# Patient Record
Sex: Male | Born: 1965 | Race: White | Hispanic: No | Marital: Single
Health system: Southern US, Community
[De-identification: ages and names within clinical notes are randomized; demographics above are authoritative.]

## PROBLEM LIST (undated history)

## (undated) DIAGNOSIS — E119 Type 2 diabetes mellitus without complications: Secondary | ICD-10-CM

---

## 2013-11-22 ENCOUNTER — Ambulatory Visit: Payer: Self-pay | Admitting: Anesthesiology

## 2013-11-22 LAB — BASIC METABOLIC PANEL
ANION GAP: 2 — AB (ref 7–16)
BUN: 10 mg/dL (ref 7–18)
CO2: 35 mmol/L — AB (ref 21–32)
Calcium, Total: 9.4 mg/dL (ref 8.5–10.1)
Chloride: 100 mmol/L (ref 98–107)
Creatinine: 0.65 mg/dL (ref 0.60–1.30)
EGFR (African American): >60
EGFR (Non-African Amer.): >60
GLUCOSE: 350 mg/dL — AB (ref 65–99)
Osmolality: 287 (ref 275–301)
Potassium: 5.2 mmol/L — ABNORMAL HIGH (ref 3.5–5.1)
Sodium: 137 mmol/L (ref 136–145)

## 2013-11-27 ENCOUNTER — Ambulatory Visit: Payer: Self-pay | Admitting: Urology

## 2014-09-09 ENCOUNTER — Observation Stay: Payer: Self-pay | Admitting: Internal Medicine

## 2014-09-09 LAB — COMPREHENSIVE METABOLIC PANEL
ALT: 46 U/L (ref 14–63)
AST: 37 U/L (ref 15–37)
Albumin: 3.3 g/dL — ABNORMAL LOW (ref 3.4–5.0)
Alkaline Phosphatase: 115 U/L (ref 46–116)
Anion Gap: 10 (ref 7–16)
BUN: 11 mg/dL (ref 7–18)
Bilirubin,Total: 0.3 mg/dL (ref 0.2–1.0)
CHLORIDE: 100 mmol/L (ref 98–107)
Calcium, Total: 9.3 mg/dL (ref 8.5–10.1)
Co2: 27 mmol/L (ref 21–32)
Creatinine: 0.69 mg/dL (ref 0.60–1.30)
EGFR (Non-African Amer.): >60
GLUCOSE: 126 mg/dL — AB (ref 65–99)
Osmolality: 275 (ref 275–301)
Potassium: 4.6 mmol/L (ref 3.5–5.1)
Sodium: 137 mmol/L (ref 136–145)
Total Protein: 7.1 g/dL (ref 6.4–8.2)

## 2014-09-09 LAB — CBC WITH DIFFERENTIAL/PLATELET
Bands: 7 %
Comment - H1-Com3: NORMAL
HCT: 35.8 % — ABNORMAL LOW (ref 40.0–52.0)
HGB: 12 g/dL — ABNORMAL LOW (ref 13.0–18.0)
LYMPHS PCT: 10 %
MCH: 30.2 pg (ref 26.0–34.0)
MCHC: 33.5 g/dL (ref 32.0–36.0)
MCV: 90 fL (ref 80–100)
MONOS PCT: 9 %
Myelocyte: 1 %
PLATELETS: 206 10*3/uL (ref 150–440)
RBC: 3.97 10*6/uL — AB (ref 4.40–5.90)
RDW: 13.3 % (ref 11.5–14.5)
Segmented Neutrophils: 73 %
WBC: 10.8 10*3/uL — AB (ref 3.8–10.6)

## 2014-09-09 LAB — URINALYSIS, COMPLETE
BLOOD: NEGATIVE
Bacteria: NONE SEEN
Bilirubin,UR: NEGATIVE
Glucose,UR: NEGATIVE mg/dL (ref 0–75)
KETONE: NEGATIVE
Leukocyte Esterase: NEGATIVE
Nitrite: NEGATIVE
PH: 6 (ref 4.5–8.0)
RBC,UR: NONE SEEN /HPF (ref 0–5)
Specific Gravity: 1.006 (ref 1.003–1.030)
Squamous Epithelial: NONE SEEN
WBC UR: NONE SEEN /HPF (ref 0–5)

## 2014-09-09 LAB — TROPONIN I: Troponin-I: <0.02 ng/mL

## 2014-09-09 LAB — PHOSPHORUS: PHOSPHORUS: 4.1 mg/dL (ref 2.5–4.9)

## 2014-09-09 LAB — PROTIME-INR
INR: 1
Prothrombin Time: 13.5 secs

## 2014-09-09 LAB — MAGNESIUM: MAGNESIUM: 1.5 mg/dL — AB

## 2014-09-10 LAB — CBC WITH DIFFERENTIAL/PLATELET
BASOS ABS: 0 10*3/uL (ref 0.0–0.1)
BASOS PCT: 0.4 %
Eosinophil #: 0.2 10*3/uL (ref 0.0–0.7)
Eosinophil %: 1.7 %
HCT: 30.7 % — AB (ref 40.0–52.0)
HGB: 10.2 g/dL — AB (ref 13.0–18.0)
LYMPHS PCT: 16 %
Lymphocyte #: 1.4 10*3/uL (ref 1.0–3.6)
MCH: 29.8 pg (ref 26.0–34.0)
MCHC: 33.4 g/dL (ref 32.0–36.0)
MCV: 89 fL (ref 80–100)
Monocyte #: 0.9 x10 3/mm (ref 0.2–1.0)
Monocyte %: 9.9 %
Neutrophil #: 6.5 10*3/uL (ref 1.4–6.5)
Neutrophil %: 72 %
Platelet: 207 10*3/uL (ref 150–440)
RBC: 3.43 10*6/uL — ABNORMAL LOW (ref 4.40–5.90)
RDW: 13.4 % (ref 11.5–14.5)
WBC: 9 10*3/uL (ref 3.8–10.6)

## 2014-09-10 LAB — BASIC METABOLIC PANEL
Anion Gap: 8 (ref 7–16)
BUN: 8 mg/dL (ref 7–18)
CALCIUM: 8.8 mg/dL (ref 8.5–10.1)
CREATININE: 0.8 mg/dL (ref 0.60–1.30)
Chloride: 103 mmol/L (ref 98–107)
Co2: 26 mmol/L (ref 21–32)
GLUCOSE: 133 mg/dL — AB (ref 65–99)
Osmolality: 274 (ref 275–301)
Potassium: 3.8 mmol/L (ref 3.5–5.1)
SODIUM: 137 mmol/L (ref 136–145)

## 2014-09-10 LAB — CULTURE, BLOOD (SINGLE)

## 2014-09-19 ENCOUNTER — Emergency Department: Payer: Self-pay | Admitting: Emergency Medicine

## 2014-11-06 ENCOUNTER — Emergency Department
Admit: 2014-11-06 | Disposition: A | Discharge status: Home / Self Care | Payer: Self-pay | Admitting: Emergency Medicine

## 2014-11-24 NOTE — Op Note (Signed)
PATIENT NAMPrince Haney:  [name], [name] MR#:  [id number] DATE OF BIRTH:  September 21, 1965  DATE OF PROCEDURE:  11/27/2013  PREOPERATIVE DIAGNOSES: 1.  Bilateral ureterolithiasis.  2.  Flank pain.   POSTOPERATIVE DIAGNOSIS:  Flank pain.   PROCEDURES: 1.  Bilateral ureteroscopy.  2.  Fluoroscopy.   SURGEON: Anola GurneyMichael [name], MD.  ANESTHESIOLOGISMaisie Haney:  [name].  ANESTHETIC METHOD: General per Brian Fushomas, local per [name].   INDICATIONS: See the dictated history and physical. After informed consent, the patient requests the above procedure.   OPERATIVE SUMMARY: After adequate general anesthesia had been obtained, the patient was placed into dorsal lithotomy position and the perineum was prepped and draped in the usual fashion. Fluoroscopy was performed. No obvious stones were identified. At this point, 21-French cystoscope was coupled with the camera and then visually advanced into the bladder. The bladder was thoroughly inspected. No bladder mucosal lesions were identified. Both ureteral orifices were identified and had clear efflux. A 0.035 Glidewire was advanced up the right ureter under fluoroscopic guidance. No resistance was encountered. The intramural ureter was dilated to 5 mm with a balloon dilating catheter. The balloon was then deflated and dilating catheter removed, taking care to leave the guidewire in position. The ureteral access sheath was then placed. The flexible ureteroscope was then visually advanced up the ureter. No stones were identified along the course of the ureter. The entire intrarenal collecting system was inspected and again no stones were identified. No other abnormalities were identified. At this point, the ureteroscope and access sheath were removed. The procedure was repeated on the left side in an identical fashion. Again, no stones, filling defects, tumors or lesions were identified within the entire collecting system. At this point, the access sheath and guidewire were removed on the left  side. Viscous Xylocaine 10 mL was instilled within the urethra and the bladder. The procedure was then terminated and the patient was transferred to the recovery room in stable condition.   ____________________________ Brian ConnersMichael R. [name], MD mrw:ce D: 11/27/2013 14:26:59 ET T: 11/27/2013 19:49:53 ET JOB#: [id number]  cc: Brian ConnersMichael R. [name], MD, <Dictator> Brian ApeMICHAEL R [name] MD ELECTRONICALLY SIGNED 11/28/2013 8:24

## 2014-12-02 NOTE — H&P (Signed)
PATIENT NAMPrince Haney:  [name], [name] MR#:  [id number] DATE OF BIRTH:  April 24, 1966  DATE OF ADMISSION:  09/09/2014  PRIMARY CARE PHYSICIAN: [facility]Leonard F. [facility]  REFERRING EMERGENCY ROOM PHYSICIAN: Brian Inchory R. York CeriseForbach, MD    CHIEF COMPLAINT: Leg swelling and redness.   HISTORY OF PRESENTING ILLNESS: This is a 49 year old male with a history of hypertension, diabetes, and hyperlipidemia who takes his medications regularly, who said that 4 days ago, he started feeling generalized aches and pains and noticed there was some redness on his left side of the leg. He took some more Motrin or Tylenol and went to work, but by evening started also feeling some fever. So the next day, he went to his primary care doctor's office and they gave him Bactrim and doxycycline prescriptions for his suspected infection on the leg and cellulitis. He took it for 3 days and today (morning) for the third day, but his swelling continued to get worse and now some soft blisters also appeared on that. He came to the Emergency Room for this problem and the ER physician gave to the hospitalist team for failure of oral antibiotic as an outpatient for cellulitis. On further questioning, he denies any insect bites. He is still able to walk with the leg. No weakness or numbness.   REVIEW OF SYSTEMS:  CONSTITUTIONAL: Positive for fever and fatigue. No weight loss or weight gain.  EYES: No blurring, double vision, discharge, or redness.  EARS, NOSE, THROAT: No tinnitus, ear pain, or hearing loss.  RESPIRATORY: No cough, wheezing, hemoptysis, or shortness of breath.  CARDIOVASCULAR: No chest pain, orthopnea, edema, arrhythmia, or palpitations.  GASTROINTESTINAL: No nausea, vomiting, diarrhea, abdominal pain.  GENITOURINARY: No dysuria, hematuria, or increased frequency.  ENDOCRINE: No heat or cold intolerance.  SKIN: The patient has swelling and redness on the left leg which is going up to his thigh today.  JOINTS: No swelling or  tenderness.  NEUROLOGICAL: No numbness, weakness, tremor, or vertigo.  PSYCHIATRIC: No anxiety, insomnia, bipolar disorder.   PAST MEDICAL HISTORY:  1.  Hypertension.  2.  Diabetes.  3.  Hyperlipidemia.   PAST SURGICAL HISTORY: Lithotripsy 2 years ago and in 1996 there was surgery for lipoma.   SOCIAL HISTORY: He smokes 12 to 15 cigarettes a day for almost 20 years now. Denies alcohol or doing any illegal drugs now. 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   FAMILY HISTORY: Mother had hypertension.   HOME MEDICATIONS:  1.  Zantac 150 mg oral tablet 2 times a day.  2.  Tapentadol 50 mg oral 4 to 6 hours as needed for pain.  3.  Tamsulosin 0.4 mg oral once a day.  4.  Tamiflu 75 mg oral capsule 2 times a day was given in the past.  5.  Bactrim 800/160 mg 2 times a day.  6.  Metformin 1000 mg oral 2 times a day.  7.  Lovastatin 10 mg once a day.  8.  Lisinopril 10 mg once a day.  9.  Doxycycline 100 mg oral 2 times a day.  10.  Aleve every 8 hours as needed for pain.   PHYSICAL EXAMINATION:  VITAL SIGNS: Temperature 98.6, pulse 111, respirations 18, blood pressure 138/87, pulse oximetry is 96% on room air.  GENERAL: The patient is fully alert and oriented to time, place, and person. Does not appear in acute distress.  HEENT: Head and neck atraumatic. Conjunctivae pink. Oral mucosa moist. Neck supple. No JVD. Thyroid nontender.  RESPIRATORY: Bilateral equal and clear air entry. No crepitation or wheezing.  CARDIOVASCULAR: S1, S2 present, regular. No murmur.  ABDOMEN: Soft, nontender. Bowel sounds present. No organomegaly.  SKIN: The patient has redness and some vesicles present on the left leg and his skin up to his left thigh is red and tense. No ulcers on the leg. Edema on the left side of the leg, right side is normal.  JOINTS: No swelling or tenderness.  NEUROLOGICAL:  Power 5/5. Follows commands and moves all 4 limbs. No tremor or rigidity. Sensation is intact.  PSYCHIATRIC: Does not appear in acute psychiatric illness at this time.   IMPORTANT LABORATORY DATA: Results: Glucose 126, creatinine 0.69, sodium 137, chloride 100, CO2 of 27, calcium is 9.3.   Albumin 3.3.   WBC 10.8, hemoglobin 12, platelet count is 206,000, MCV is 90.   Lactic acid is 1.   ASSESSMENT AND PLAN: A 49 year old male who has a history of diabetes came to hospital with complaint of left leg swelling and redness, which is getting worse and he was given Bactrim and doxycycline as outpatient but it did not help much.  1.  Cellulitis: We will give vancomycin and Zosyn intravenously and follow clinically. Does not look like any abscess at this time, but if it gets worse, we might need to get a surgical consult.  2.  Diabetes: We will hold oral medications in setting of cellulitis and infection and continue his insulin sliding scale coverage.  3.  Hypertension: Continue lisinopril for now. Blood pressure is stable.  4.  Hyperlipidemia: Continue statin.  5.  Pain: We will give Percocet in the hospital for the pain. On discharge, we may resume his medications.  6.  Smoking: Smoking cessation counseling is Haney for 4 minutes and offered him a nicotine patch. He does not want to have nicotine right now and will try to quit smoking in the future.   TOTAL TIME SPENT ON THIS ADMISSION: 50 minutes.    ____________________________ [name] Brian Pigeon, MD vgv:ts D: 09/09/2014 15:23:45 ET T: 09/09/2014 15:48:57 ET JOB#: [id number]  cc: [name]. Brian Pigeon, MD, <Dictator> Brian Done, MD Altamese Dilling MD ELECTRONICALLY SIGNED 09/19/2014 15:58

## 2014-12-02 NOTE — Consult Note (Signed)
Brief Consult Note: Diagnosis: left leg cellulitis.   Patient was seen by consultant.   Consult note dictated.   Comments: cellulitis improving but question if there is a component of contact dermatitis will follow.  Electronic Signatures: Lattie Hawooper, Damarco Keysor E (MD)  (Signed 09-Feb-16 22:18)  Authored: Brief Consult Note   Last Updated: 09-Feb-16 22:18 by Lattie Hawooper, Adhira Jamil E (MD)

## 2014-12-02 NOTE — Discharge Summary (Signed)
PATIENT NAMEKWESI, [name] MR#:  [id number] DATE OF BIRTH:  Sep 14, 1965  DATE OF ADMISSION:  09/09/2014 DATE OF DISCHARGE:  09/12/2014  DISCHARGE DIAGNOSES:  1. Cellulitis on the leg.  2. Diabetes.  3. Hypertension.  4. Hyperlipidemia.  5. Smoking.   CODE STATUS ON DISCHARGE: Full code   MEDICATIONS ON DISCHARGE:   1. Lisinopril 10 mg oral once a day.  2. Zantac 150 mg 2 times a day.  3. Subutex 20 mg oral once a day in the morning.  4. Diabetes health packet was given to patient.  5. Tamsulosin 0.4 mg oral capsule once a day.  6. Aleve 220 mg oral tablet every 8 hours as needed for pain.  7. Tapentadol 50 mg oral tablet every 4-6 hours as needed for pain.  8. Metformin 500 mg oral tablet 2 times a day with meals.  9. Lovastatin 40 mg once a day.   10. Nicotine patch 21 mg once a day.  11. Amoxicillin and clavulanate 875 + 125 mg oral tablet every 12 hours for 10 days.    DISCHARGE INSTRUCTIONS:  Advised to have low sodium and low carbohydrate diet, regular consistency, and follow with PMD in 1-2 weeks.   HISTORY OF PRESENTING ILLNESS: A 49 year old male with history of hypertension, diabetes, hyperlipidemia, takes his medications regularly, said that 4 days ago he started feeling generalized aches and pains and noticed there was some redness on his left side of the leg. He took some Motrin and Tylenol and went to work, but in the evening started feeling some fever, so the next day he went to his primary doctor's office and he gave him Bactrim and doxycycline for possible infection and cellulitis. He took it for 3 days, but it did not help with the swelling, swelling continued to get worse, and there was some blister also appeared on his leg and it was getting more and more red all the way up to thigh, so he decided to come to Emergency Room for further management. ER physician gave to hospitalist team because he failed outpatient management of his cellulitis.   HOSPITAL COURSE AND STAY:    1.  As he failed outpatient therapy for cellulitis with Bactrim and doxycycline we started on vancomycin and Zosyn IV. The patient had gradual and slow improvement with that  and his swelling was coming down, redness came down significantly. We also did DVT study to rule out any clots which was negative. Surgical consult was called in for any required intervention, but as per them it was just superficial cellulitis and nothing more needed, and so we discharged the patient on Augmentin oral for 10 more days to finish total 2 weeks of therapy.  2.  Diabetes. His oral medications were on hold in the hospital, but as his infection and swelling were improving we restarted his metformin on discharge and advised him and counseled on diet control. He agreed for that and education was given in hospital.  3.  Hypertension. Continued lisinopril. Blood pressure was stable.  4.  Hyperlipidemia. Continue statin.  5.  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 He was using Subutex at home. We resumed and dose adjustment was done by pharmacy in hospital.  6.  Smoking cessation counseling was done for 4 minutes and offered nicotine patch.   CONSULTS IN THE HOSPITAL: Surgical consult with Dr. Dionne Milo.     IMPORTANT LABORATORY REPORTS IN THE HOSPITAL: Blood culture on admission was negative. WBC count on admission  was 10.8, hemoglobin 12, and platelet count was 206,000, MCV was 90. His creatinine on admission 0.69, sodium 137, potassium 4.6, and chloride was 100, CO2 was 27. Magnesium level was 1.5. Troponin less than 0.02. Urinalysis was negative. On next day, on February 8, WBC 9000, hemoglobin 10.2, and platelet count 207,000. Creatinine was 0.8. Ultrasound of the lower extremity on the left side done, no DVTs.   TOTAL TIME SPENT ON THIS DISCHARGE: 40 minutes.    ____________________________ Hope PigeonVaibhavkumar G. Elisabeth PigeonVachhani, MD vgv:bu D: 09/14/2014 09:13:30 ET T: 09/14/2014 13:22:07 ET JOB#: [id number]  cc: Hope PigeonVaibhavkumar  G. Elisabeth PigeonVachhani, MD, <Dictator> Altamese DillingVAIBHAVKUMAR Tayvin Preslar MD ELECTRONICALLY SIGNED 09/19/2014 16:00

## 2017-02-13 IMAGING — CT CT TIBIA FIBULA LEFT WITHOUT CONTRAST
3 of 5 series · 8 of 35 positions shown, 9 images · non-contrast
Comparison: None.

CLINICAL DATA: Redness, pain and swelling of the left lower leg.

EXAM:
CT TIBIA FIBULA LEFT WITHOUT CONTRAST
TECHNIQUE: Multidetector CT imaging was performed according to the standard
protocol. Multiplanar CT image reconstructions were also generated.

[Series 2: lt tib/fib · axial · 0.42mm/px · z∈[-326,-116]mm · 2 of 328 slices shown, 3 images]
[im 94/328  soft-tissue]
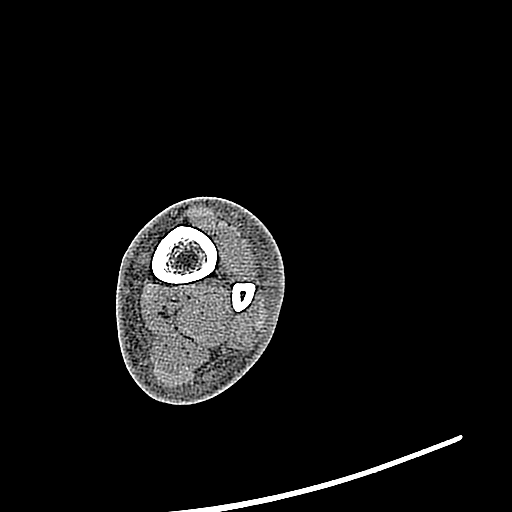
[im 94/328  bone]
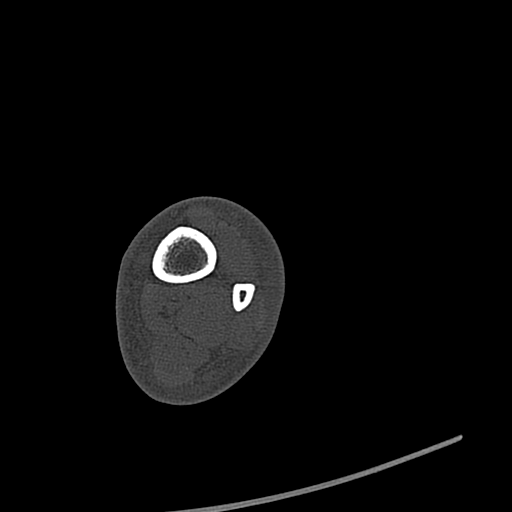
[im 234/328  bone]
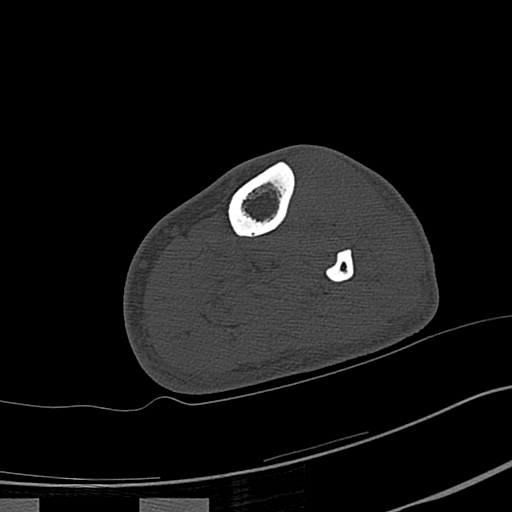

[Series 6: lt tib/fib bone cor · coronal · 0.33mm/px · 1 of 76 slices shown]
[im 38/76  bone]
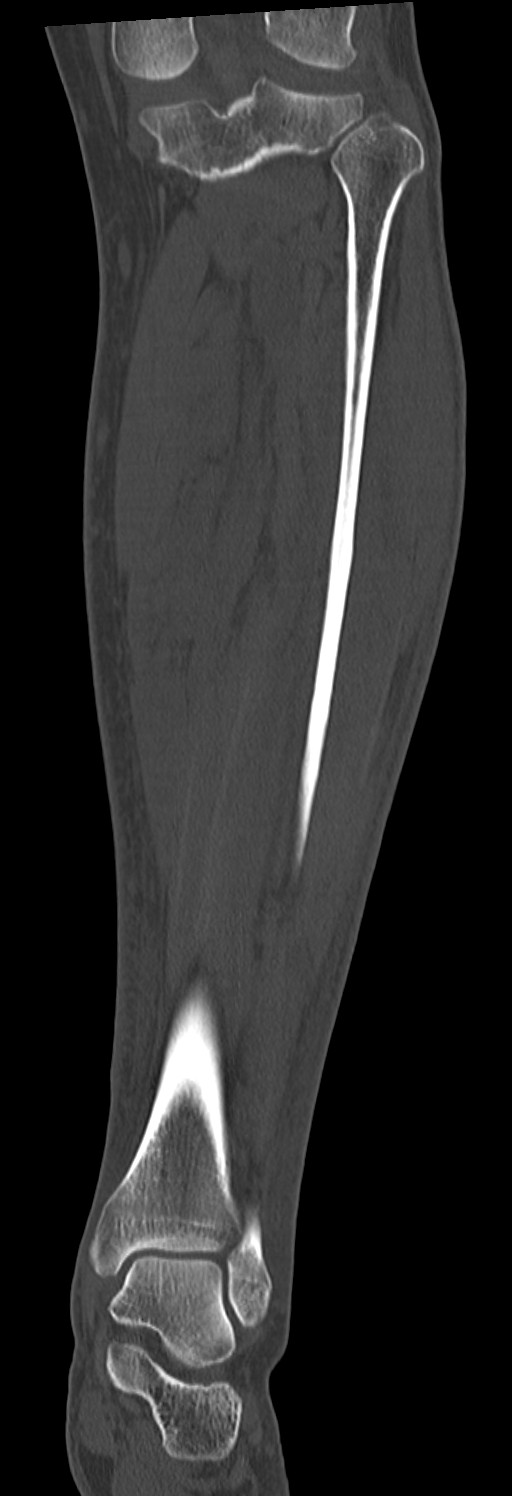

[Series 10: lt tib/fib st sag · sagittal · 0.34mm/px · 5 of 66 slices shown]
[im 22/66  bone]
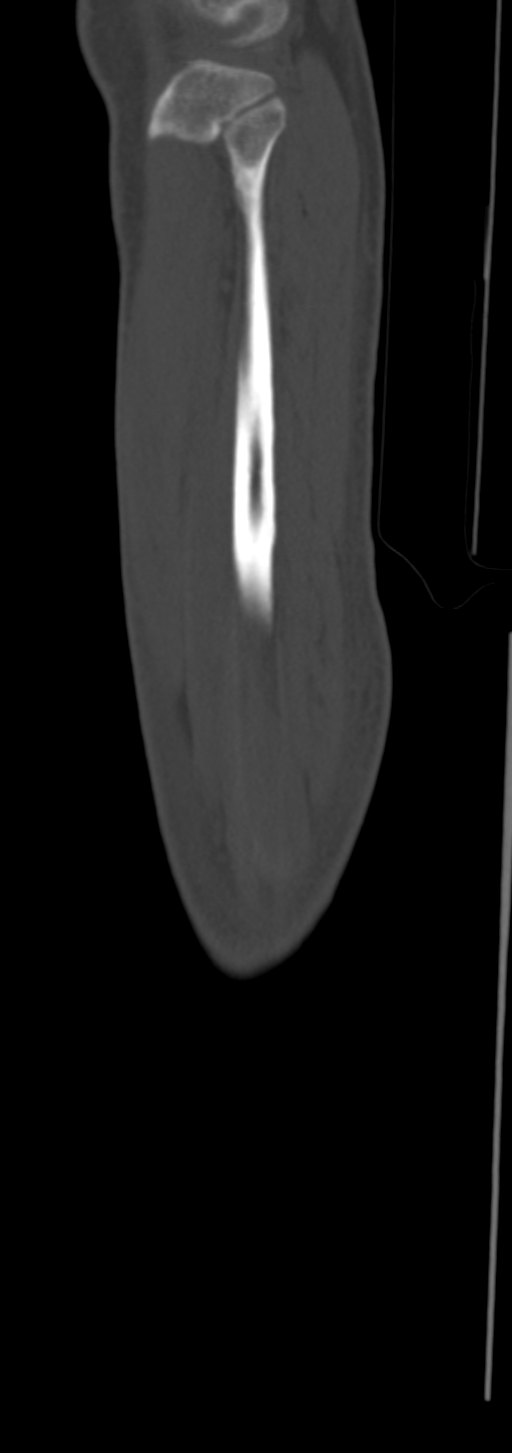
[im 28/66  bone]
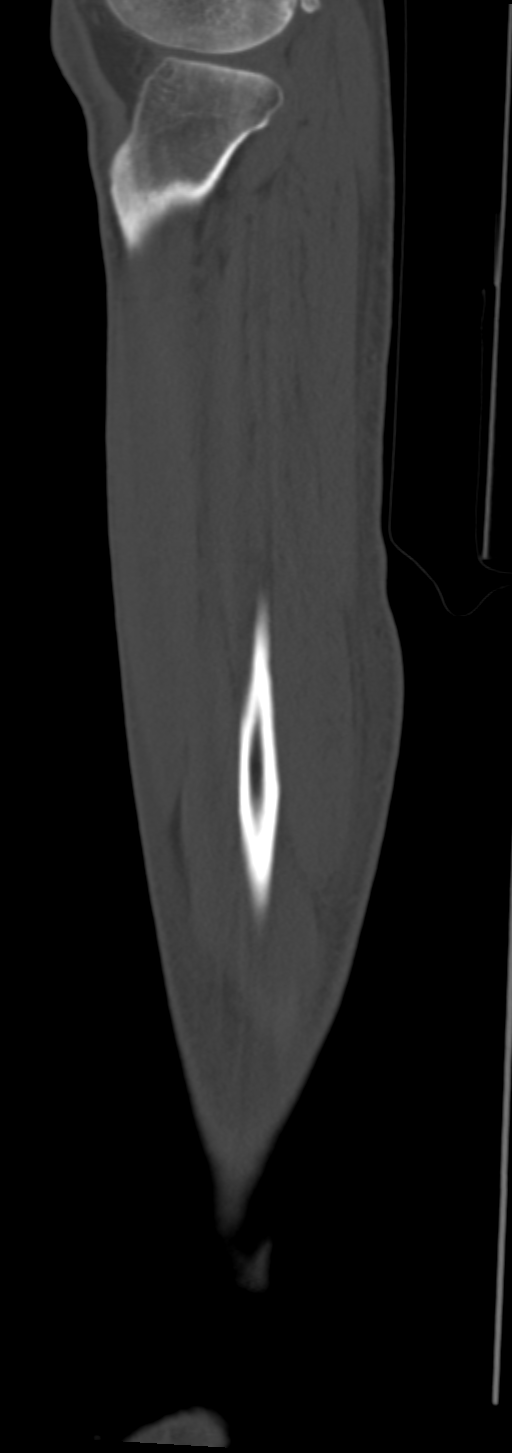
[im 33/66  bone]
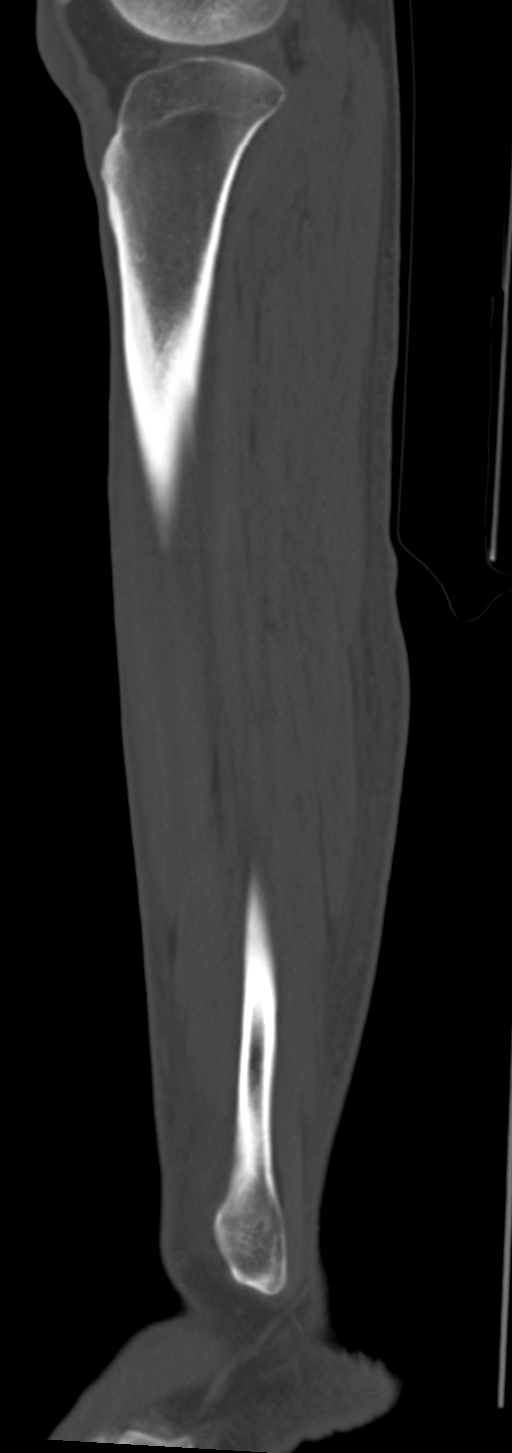
[im 38/66  bone]
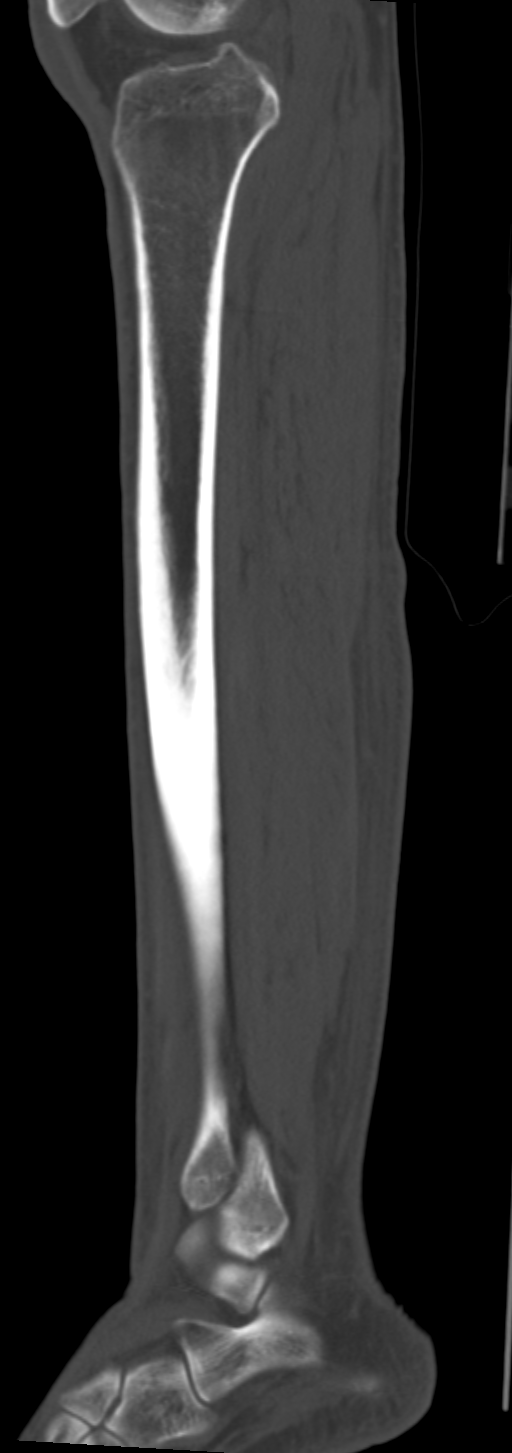
[im 44/66  bone]
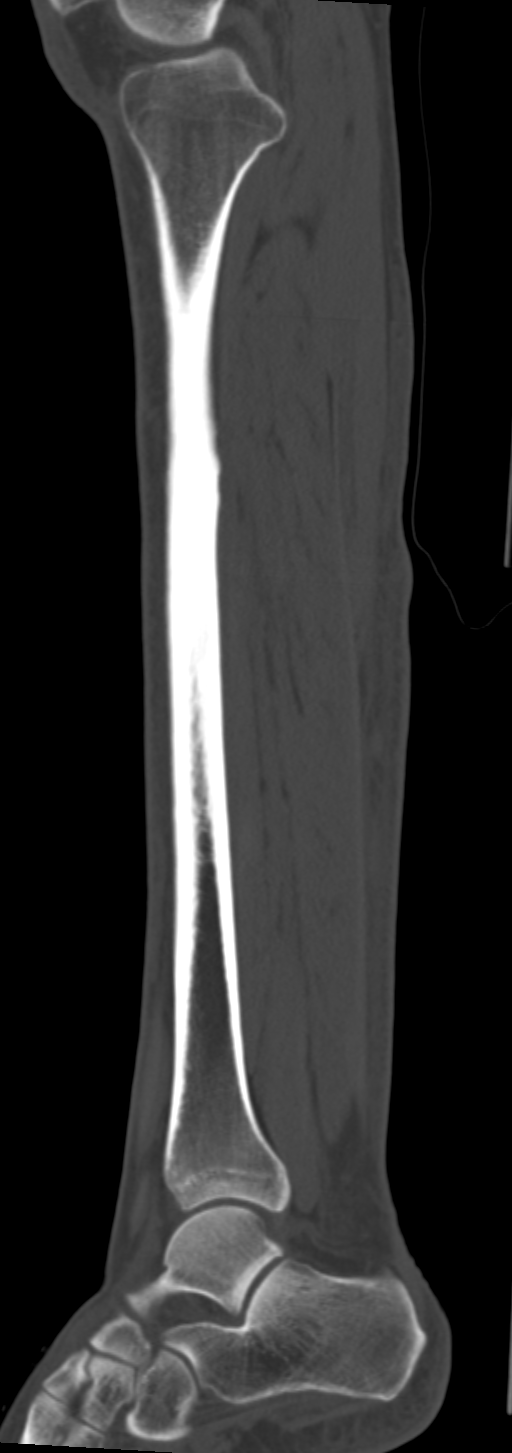

[8 of 35 positions shown; findings below may reference images not displayed]

FINDINGS: Infiltration of subcutaneous fat is seen about the left lower leg.
No focal fluid collection is identified. Fat planes within muscle
are preserved. No gas collection or gas dissecting along fracture
planes. No radiopaque foreign body is identified. Muscles and
tendons appear intact. There is no bony abnormality.
IMPRESSION: Findings compatible with cellulitis or dependent change of the left
lower leg without evidence of osteomyelitis or abscess.

## 2017-09-30 ENCOUNTER — Observation Stay
Admission: EM | Admit: 2017-09-30 | Discharge: 2017-10-02 | Disposition: A | Discharge status: Home / Self Care | Payer: BLUE CROSS/BLUE SHIELD | Attending: Family Medicine | Admitting: Family Medicine

## 2017-09-30 ENCOUNTER — Emergency Department: Payer: BLUE CROSS/BLUE SHIELD

## 2017-09-30 ENCOUNTER — Encounter: Payer: Self-pay | Admitting: *Deleted

## 2017-09-30 ENCOUNTER — Other Ambulatory Visit: Payer: Self-pay

## 2017-09-30 DIAGNOSIS — S329XXA Fracture of unspecified parts of lumbosacral spine and pelvis, initial encounter for closed fracture: Secondary | ICD-10-CM | POA: Diagnosis present

## 2017-09-30 DIAGNOSIS — Y92410 Unspecified street and highway as the place of occurrence of the external cause: Secondary | ICD-10-CM | POA: Diagnosis not present

## 2017-09-30 DIAGNOSIS — S32491A Other specified fracture of right acetabulum, initial encounter for closed fracture: Secondary | ICD-10-CM | POA: Diagnosis not present

## 2017-09-30 DIAGNOSIS — I10 Essential (primary) hypertension: Secondary | ICD-10-CM | POA: Diagnosis not present

## 2017-09-30 DIAGNOSIS — S32409A Unspecified fracture of unspecified acetabulum, initial encounter for closed fracture: Secondary | ICD-10-CM

## 2017-09-30 DIAGNOSIS — Z79899 Other long term (current) drug therapy: Secondary | ICD-10-CM | POA: Insufficient documentation

## 2017-09-30 DIAGNOSIS — F17200 Nicotine dependence, unspecified, uncomplicated: Secondary | ICD-10-CM | POA: Insufficient documentation

## 2017-09-30 DIAGNOSIS — S0083XA Contusion of other part of head, initial encounter: Secondary | ICD-10-CM | POA: Insufficient documentation

## 2017-09-30 DIAGNOSIS — E119 Type 2 diabetes mellitus without complications: Secondary | ICD-10-CM | POA: Insufficient documentation

## 2017-09-30 DIAGNOSIS — Z7984 Long term (current) use of oral hypoglycemic drugs: Secondary | ICD-10-CM | POA: Diagnosis not present

## 2017-09-30 DIAGNOSIS — E785 Hyperlipidemia, unspecified: Secondary | ICD-10-CM | POA: Insufficient documentation

## 2017-09-30 DIAGNOSIS — W19XXXA Unspecified fall, initial encounter: Secondary | ICD-10-CM | POA: Diagnosis present

## 2017-09-30 DIAGNOSIS — S2242XA Multiple fractures of ribs, left side, initial encounter for closed fracture: Principal | ICD-10-CM

## 2017-09-30 DIAGNOSIS — T148XXA Other injury of unspecified body region, initial encounter: Secondary | ICD-10-CM

## 2017-09-30 HISTORY — DX: Type 2 diabetes mellitus without complications: E11.9

## 2017-09-30 LAB — COMPREHENSIVE METABOLIC PANEL
ALT: 55 U/L (ref 17–63)
AST: 57 U/L — AB (ref 15–41)
Albumin: 3.8 g/dL (ref 3.5–5.0)
Alkaline Phosphatase: 48 U/L (ref 38–126)
Anion gap: 7 (ref 5–15)
BUN: 11 mg/dL (ref 6–20)
CHLORIDE: 103 mmol/L (ref 101–111)
CO2: 29 mmol/L (ref 22–32)
Calcium: 9.5 mg/dL (ref 8.9–10.3)
Creatinine, Ser: 0.65 mg/dL (ref 0.61–1.24)
GFR calc Af Amer: >60 mL/min (ref >60)
Glucose, Bld: 242 mg/dL — ABNORMAL HIGH (ref 65–99)
Potassium: 4 mmol/L (ref 3.5–5.1)
Sodium: 139 mmol/L (ref 135–145)
Total Bilirubin: 0.4 mg/dL (ref 0.3–1.2)
Total Protein: 6.9 g/dL (ref 6.5–8.1)

## 2017-09-30 LAB — CBC WITH DIFFERENTIAL/PLATELET
BASOS ABS: 0 10*3/uL (ref 0–0.1)
BASOS PCT: 0 %
Eosinophils Absolute: 0.3 10*3/uL (ref 0–0.7)
Eosinophils Relative: 4 %
HCT: 36.4 % — ABNORMAL LOW (ref 40.0–52.0)
Hemoglobin: 12.4 g/dL — ABNORMAL LOW (ref 13.0–18.0)
LYMPHS PCT: 19 %
Lymphs Abs: 1.4 10*3/uL (ref 1.0–3.6)
MCH: 30.8 pg (ref 26.0–34.0)
MCHC: 34 g/dL (ref 32.0–36.0)
MCV: 90.6 fL (ref 80.0–100.0)
MONO ABS: 0.5 10*3/uL (ref 0.2–1.0)
Monocytes Relative: 7 %
Neutro Abs: 5.2 10*3/uL (ref 1.4–6.5)
Neutrophils Relative %: 70 %
PLATELETS: 186 10*3/uL (ref 150–440)
RBC: 4.01 MIL/uL — ABNORMAL LOW (ref 4.40–5.90)
RDW: 12.9 % (ref 11.5–14.5)
WBC: 7.3 10*3/uL (ref 3.8–10.6)

## 2017-09-30 LAB — URINE DRUG SCREEN, QUALITATIVE (ARMC ONLY)
AMPHETAMINES, UR SCREEN: NOT DETECTED
Barbiturates, Ur Screen: NOT DETECTED
Benzodiazepine, Ur Scrn: POSITIVE — AB
Cannabinoid 50 Ng, Ur ~~LOC~~: NOT DETECTED
Cocaine Metabolite,Ur ~~LOC~~: NOT DETECTED
MDMA (ECSTASY) UR SCREEN: NOT DETECTED
Methadone Scn, Ur: NOT DETECTED
Opiate, Ur Screen: NOT DETECTED
Phencyclidine (PCP) Ur S: NOT DETECTED
TRICYCLIC, UR SCREEN: NOT DETECTED

## 2017-09-30 LAB — ACETAMINOPHEN LEVEL

## 2017-09-30 LAB — TROPONIN I

## 2017-09-30 LAB — SALICYLATE LEVEL

## 2017-09-30 MED ORDER — IOPAMIDOL (ISOVUE-300) INJECTION 61%
100.0000 mL | Freq: Once | INTRAVENOUS | Status: AC | PRN
  Administered 2017-09-30: 100 mL via INTRAVENOUS

## 2017-09-30 MED ORDER — FENTANYL CITRATE (PF) 100 MCG/2ML IJ SOLN: 50.0000 ug | INTRAMUSCULAR | Status: DC | PRN

## 2017-09-30 MED ORDER — NAPROXEN 375 MG PO TABS: 375.0000 mg | ORAL_TABLET | Freq: Two times a day (BID) | ORAL | 0 refills | Status: AC

## 2017-09-30 MED ORDER — CYCLOBENZAPRINE HCL 5 MG PO TABS: 5.0000 mg | ORAL_TABLET | Freq: Three times a day (TID) | ORAL | 0 refills | Status: DC | PRN
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
## 2017-09-30 NOTE — ED Notes (Signed)
Iv started and labs sent.  Pt sleepy.  Denies drug or etoh use.  nsr on monitor.
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
## 2017-09-30 NOTE — ED Notes (Signed)
ED Provider at bedside. 

## 2017-09-30 NOTE — ED Notes (Signed)
Patient transported to CT 
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
## 2017-09-30 NOTE — ED Notes (Signed)
Report off to vanessa rn 

## 2017-10-01 ENCOUNTER — Other Ambulatory Visit: Payer: Self-pay

## 2017-10-01 DIAGNOSIS — S329XXA Fracture of unspecified parts of lumbosacral spine and pelvis, initial encounter for closed fracture: Secondary | ICD-10-CM | POA: Diagnosis present

## 2017-10-01 LAB — GLUCOSE, CAPILLARY
GLUCOSE-CAPILLARY: 172 mg/dL — AB (ref 65–99)
Glucose-Capillary: 114 mg/dL — ABNORMAL HIGH (ref 65–99)
Glucose-Capillary: 229 mg/dL — ABNORMAL HIGH (ref 65–99)
Glucose-Capillary: 130 mg/dL — ABNORMAL HIGH (ref 65–99)

## 2017-10-01 LAB — CBC
HEMATOCRIT: 36.5 % — AB (ref 40.0–52.0)
Hemoglobin: 12.3 g/dL — ABNORMAL LOW (ref 13.0–18.0)
MCH: 30.5 pg (ref 26.0–34.0)
MCHC: 33.7 g/dL (ref 32.0–36.0)
MCV: 90.5 fL (ref 80.0–100.0)
Platelets: 190 10*3/uL (ref 150–440)
RBC: 4.03 MIL/uL — ABNORMAL LOW (ref 4.40–5.90)
RDW: 13.1 % (ref 11.5–14.5)
WBC: 9 10*3/uL (ref 3.8–10.6)

## 2017-10-01 LAB — BASIC METABOLIC PANEL
ANION GAP: 7 (ref 5–15)
BUN: 9 mg/dL (ref 6–20)
CALCIUM: 9.2 mg/dL (ref 8.9–10.3)
CO2: 26 mmol/L (ref 22–32)
Chloride: 107 mmol/L (ref 101–111)
Creatinine, Ser: 0.41 mg/dL — ABNORMAL LOW (ref 0.61–1.24)
GFR calc Af Amer: >60 mL/min (ref >60)
Glucose, Bld: 108 mg/dL — ABNORMAL HIGH (ref 65–99)
POTASSIUM: 3.7 mmol/L (ref 3.5–5.1)
SODIUM: 140 mmol/L (ref 135–145)

## 2017-10-01 MED ORDER — GLIPIZIDE 5 MG PO TABS
5.0000 mg | ORAL_TABLET | Freq: Every day | ORAL | Status: DC
  Administered 2017-10-01: 5 mg via ORAL
  Filled 2017-10-01: qty 1

## 2017-10-01 MED ORDER — GABAPENTIN 300 MG PO CAPS
300.0000 mg | ORAL_CAPSULE | Freq: Three times a day (TID) | ORAL | Status: DC
  Administered 2017-10-01 – 2017-10-02 (×4): 300 mg via ORAL
  Filled 2017-10-01 (×4): qty 1

## 2017-10-01 MED ORDER — ACETAMINOPHEN 325 MG PO TABS: 650.0000 mg | ORAL_TABLET | Freq: Four times a day (QID) | ORAL | Status: DC | PRN

## 2017-10-01 MED ORDER — GLIPIZIDE 5 MG PO TABS
5.0000 mg | ORAL_TABLET | Freq: Every day | ORAL | Status: DC
  Administered 2017-10-02: 5 mg via ORAL
  Filled 2017-10-01: qty 1

## 2017-10-01 MED ORDER — ACETAMINOPHEN 650 MG RE SUPP: 650.0000 mg | Freq: Four times a day (QID) | RECTAL | Status: DC | PRN

## 2017-10-01 MED ORDER — INSULIN ASPART 100 UNIT/ML ~~LOC~~ SOLN
0.0000 [IU] | Freq: Three times a day (TID) | SUBCUTANEOUS | Status: DC
  Administered 2017-10-01: 3 [IU] via SUBCUTANEOUS
  Administered 2017-10-01: 2 [IU] via SUBCUTANEOUS
  Administered 2017-10-02: 5 [IU] via SUBCUTANEOUS
  Administered 2017-10-02: 1 [IU] via SUBCUTANEOUS
  Filled 2017-10-01 (×4): qty 1

## 2017-10-01 MED ORDER — HYDROCODONE-ACETAMINOPHEN 5-325 MG PO TABS
1.0000 | ORAL_TABLET | ORAL | Status: DC | PRN
  Administered 2017-10-01 – 2017-10-02 (×5): 1 via ORAL
  Filled 2017-10-01 (×5): qty 1

## 2017-10-01 MED ORDER — SODIUM CHLORIDE 0.9 % IV SOLN
INTRAVENOUS | Status: DC
  Administered 2017-10-01: 04:00:00 via INTRAVENOUS

## 2017-10-01 MED ORDER — ONDANSETRON HCL 4 MG PO TABS: 4.0000 mg | ORAL_TABLET | Freq: Four times a day (QID) | ORAL | Status: DC | PRN

## 2017-10-01 MED ORDER — METFORMIN HCL 500 MG PO TABS
1000.0000 mg | ORAL_TABLET | Freq: Two times a day (BID) | ORAL | Status: DC
  Administered 2017-10-01 – 2017-10-02 (×3): 1000 mg via ORAL
  Filled 2017-10-01 (×4): qty 2

## 2017-10-01 MED ORDER — INSULIN ASPART 100 UNIT/ML ~~LOC~~ SOLN: 0.0000 [IU] | Freq: Every day | SUBCUTANEOUS | Status: DC

## 2017-10-01 MED ORDER — DOCUSATE SODIUM 100 MG PO CAPS
100.0000 mg | ORAL_CAPSULE | Freq: Two times a day (BID) | ORAL | Status: DC
  Administered 2017-10-01 – 2017-10-02 (×3): 100 mg via ORAL
  Filled 2017-10-01 (×3): qty 1

## 2017-10-01 MED ORDER — HEPARIN SODIUM (PORCINE) 5000 UNIT/ML IJ SOLN
5000.0000 [IU] | Freq: Three times a day (TID) | INTRAMUSCULAR | Status: DC
  Administered 2017-10-01 – 2017-10-02 (×4): 5000 [IU] via SUBCUTANEOUS
  Filled 2017-10-01 (×4): qty 1

## 2017-10-01 MED ORDER — BISACODYL 5 MG PO TBEC: 5.0000 mg | DELAYED_RELEASE_TABLET | Freq: Every day | ORAL | Status: DC | PRN

## 2017-10-01 MED ORDER — LISINOPRIL 10 MG PO TABS
10.0000 mg | ORAL_TABLET | Freq: Every day | ORAL | Status: DC
  Administered 2017-10-01 – 2017-10-02 (×2): 10 mg via ORAL
  Filled 2017-10-01 (×2): qty 1

## 2017-10-01 MED ORDER — ONDANSETRON HCL 4 MG/2ML IJ SOLN: 4.0000 mg | Freq: Four times a day (QID) | INTRAMUSCULAR | Status: DC | PRN

## 2017-10-01 MED ORDER — PRAVASTATIN SODIUM 20 MG PO TABS
10.0000 mg | ORAL_TABLET | Freq: Every day | ORAL | Status: DC
  Administered 2017-10-01: 10 mg via ORAL
  Filled 2017-10-01: qty 1

## 2017-10-01 NOTE — Progress Notes (Signed)
Inpatient Diabetes Program Recommendations  AACE/ADA: New Consensus Statement on Inpatient Glycemic Control (2015)  Target Ranges:  Prepandial:   less than 140 mg/dL      Peak postprandial:   less than 180 mg/dL (1-2 hours)      Critically ill patients:  140 - 180 mg/dL   Lab Results  Component Value Date   GLUCAP 172 (H) 10/01/2017    Review of Glycemic Control  Results for [place] Rome[place]OSER, [place] (MRN [id number]) as of 10/01/2017 14:27  Ref. Range 10/01/2017 07:42 10/01/2017 12:03  Glucose-Capillary Latest Ref Range: 65 - 99 mg/dL 132114 (H) 440172 (H)    Diabetes history: Type 2 Outpatient Diabetes medications: Glipizide 5mg /day, Glucophage 1000mg  bid  Current orders for Inpatient glycemic control: Glipizide 5mg /day, Glucophage 1000mg  bid, Novolog 0-5 units qhs, Novolog 0-9 units tid  Inpatient Diabetes Program Recommendations: Please consider d/c Glipizide while patient is in the hospital. High risk of hypoglycemia if patient has to be NPO.  Susette RacerJulie Davinia Riccardi, RN, BA, MHA, CDE Diabetes Coordinator Inpatient Diabetes Program  [phone] (Team Pager) [phone] [facility] Office) 10/01/2017 2:29 PM
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
## 2017-10-02 DIAGNOSIS — W19XXXA Unspecified fall, initial encounter: Secondary | ICD-10-CM | POA: Diagnosis present

## 2017-10-02 LAB — HIV ANTIBODY (ROUTINE TESTING W REFLEX): HIV SCREEN 4TH GENERATION: NONREACTIVE

## 2017-10-02 LAB — GLUCOSE, CAPILLARY
GLUCOSE-CAPILLARY: 131 mg/dL — AB (ref 65–99)
GLUCOSE-CAPILLARY: 299 mg/dL — AB (ref 65–99)

## 2017-10-02 MED ORDER — HYDROCODONE-ACETAMINOPHEN 5-325 MG PO TABS: 1.0000 | ORAL_TABLET | ORAL | 0 refills | Status: DC | PRN
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
## 2017-10-02 NOTE — Progress Notes (Signed)
Subjective: Pain is moderated.  Is work with PT on a walker and is ready to go home.  Will remain toe-touch weightbearing only.       Patient reports pain as moderate.  Objective:   VITALS:   Vitals:   10/02/17 0814 10/02/17 1241  BP: (!) 149/90 (!) 143/90  Pulse: 80 84  Resp: 18 18  Temp: 98.7 F (37.1 C) 98.4 F (36.9 C)  SpO2: 96% 97%    Neurologically intact ABD soft Neurovascular intact Sensation intact distally Intact pulses distally Dorsiflexion/Plantar flexion intact  LABS Recent Labs    09/30/17 1909 10/01/17 0515  HGB 12.4* 12.3*  HCT 36.4* 36.5*  WBC 7.3 9.0  PLT 186 190    Recent Labs    09/30/17 1909 10/01/17 0515  NA 139 140  K 4.0 3.7  BUN 11 9  CREATININE 0.65 0.41*  GLUCOSE 242* 108*    No results for input(s): LABPT, INR in the last 72 hours.   Assessment/Plan:      Discharge home today. Toe-touch weightbearing only on the right. Recommend enteric-coated aspirin twice daily for 6 weeks.

## 2017-10-02 NOTE — Care Management Note (Signed)
Case Management Note  Patient Details  Name: [name] MRN: [id number] Date of Birth: May 08, 1966  Subjective/Objective:   [name] now reports that he needs a RW and crutches. Dr Katheren ShamsSalary nixed the crutches because [name] has fractured ribs. A RW was requested from [facility].                  Action/Plan:   Expected Discharge Date:  10/02/17               Expected Discharge Plan:  Home w Home Health Services  In-House Referral:     Discharge planning Services  CM Consult  Post Acute Care Choice:    Choice offered to:  Patient  DME Arranged:    DME Agency:     HH Arranged:  PT HH Agency:  [facility]  Status of Service:  Completed, signed off  If discussed at Long Length of Stay Meetings, dates discussed:    Additional Comments:  Tajia Szeliga A, RN 10/02/2017, 2:42 PM

## 2018-01-22 ENCOUNTER — Other Ambulatory Visit: Payer: Self-pay

## 2018-01-22 ENCOUNTER — Encounter: Payer: Self-pay | Admitting: Emergency Medicine

## 2018-01-22 ENCOUNTER — Emergency Department
Admission: EM | Admit: 2018-01-22 | Discharge: 2018-01-22 | Disposition: A | Discharge status: Home / Self Care | Payer: BLUE CROSS/BLUE SHIELD | Attending: Student in an Organized Health Care Education/Training Program | Admitting: Student in an Organized Health Care Education/Training Program

## 2018-01-22 DIAGNOSIS — T887XXA Unspecified adverse effect of drug or medicament, initial encounter: Secondary | ICD-10-CM | POA: Diagnosis not present

## 2018-01-22 DIAGNOSIS — R400 Somnolence: Secondary | ICD-10-CM

## 2018-01-22 DIAGNOSIS — F17210 Nicotine dependence, cigarettes, uncomplicated: Secondary | ICD-10-CM | POA: Diagnosis not present

## 2018-01-22 DIAGNOSIS — Y829 Unspecified medical devices associated with adverse incidents: Secondary | ICD-10-CM | POA: Diagnosis not present

## 2018-01-22 DIAGNOSIS — E1165 Type 2 diabetes mellitus with hyperglycemia: Secondary | ICD-10-CM | POA: Diagnosis not present

## 2018-01-22 DIAGNOSIS — Z7984 Long term (current) use of oral hypoglycemic drugs: Secondary | ICD-10-CM | POA: Insufficient documentation

## 2018-01-22 DIAGNOSIS — F1190 Opioid use, unspecified, uncomplicated: Secondary | ICD-10-CM

## 2018-01-22 DIAGNOSIS — T424X5A Adverse effect of benzodiazepines, initial encounter: Secondary | ICD-10-CM | POA: Insufficient documentation

## 2018-01-22 DIAGNOSIS — R739 Hyperglycemia, unspecified: Secondary | ICD-10-CM

## 2018-01-22 LAB — URINALYSIS, COMPLETE (UACMP) WITH MICROSCOPIC
BACTERIA UA: NONE SEEN
BILIRUBIN URINE: NEGATIVE
KETONES UR: NEGATIVE mg/dL
LEUKOCYTES UA: NEGATIVE
NITRITE: NEGATIVE
Protein, ur: NEGATIVE mg/dL
SPECIFIC GRAVITY, URINE: 1.016 (ref 1.005–1.030)
Squamous Epithelial / LPF: NONE SEEN (ref 0–5)
pH: 6 (ref 5.0–8.0)

## 2018-01-22 LAB — URINE DRUG SCREEN, QUALITATIVE (ARMC ONLY)
AMPHETAMINES, UR SCREEN: NOT DETECTED
Benzodiazepine, Ur Scrn: POSITIVE — AB
COCAINE METABOLITE, UR ~~LOC~~: NOT DETECTED
Cannabinoid 50 Ng, Ur ~~LOC~~: NOT DETECTED
MDMA (Ecstasy)Ur Screen: NOT DETECTED
METHADONE SCREEN, URINE: NOT DETECTED
Opiate, Ur Screen: POSITIVE — AB
PHENCYCLIDINE (PCP) UR S: NOT DETECTED
Tricyclic, Ur Screen: NOT DETECTED

## 2018-01-22 LAB — CBC
HEMATOCRIT: 35.7 % — AB (ref 40.0–52.0)
Hemoglobin: 12.4 g/dL — ABNORMAL LOW (ref 13.0–18.0)
MCH: 31.8 pg (ref 26.0–34.0)
MCHC: 34.8 g/dL (ref 32.0–36.0)
MCV: 91.3 fL (ref 80.0–100.0)
Platelets: 187 10*3/uL (ref 150–440)
RBC: 3.91 MIL/uL — ABNORMAL LOW (ref 4.40–5.90)
RDW: 13.3 % (ref 11.5–14.5)
WBC: 5.7 10*3/uL (ref 3.8–10.6)

## 2018-01-22 LAB — BASIC METABOLIC PANEL
Anion gap: 8 (ref 5–15)
BUN: 19 mg/dL (ref 6–20)
CALCIUM: 8.9 mg/dL (ref 8.9–10.3)
CO2: 27 mmol/L (ref 22–32)
CREATININE: 0.72 mg/dL (ref 0.61–1.24)
Chloride: 100 mmol/L — ABNORMAL LOW (ref 101–111)
GFR calc Af Amer: >60 mL/min (ref >60)
GLUCOSE: 371 mg/dL — AB (ref 65–99)
Potassium: 3.7 mmol/L (ref 3.5–5.1)
SODIUM: 135 mmol/L (ref 135–145)

## 2018-01-22 LAB — ETHANOL: Alcohol, Ethyl (B): <10 mg/dL (ref <10)

## 2018-01-22 LAB — GLUCOSE, CAPILLARY: GLUCOSE-CAPILLARY: 404 mg/dL — AB (ref 65–99)
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
## 2018-01-22 NOTE — ED Provider Notes (Signed)
[facility] Emergency Department Provider Note  ____________________________________________   First MD Initiated Contact with Patient 01/22/18 [phone]     (approximate)  I have reviewed the triage vital signs and the nursing notes.   HISTORY  Chief Complaint Fatigue  Level 5 caveat:  history/ROS limited by altered mental status/confusion   HPI [name] is a 52 y.o. male with medical history as listed below who presents by EMS for evaluation of fatigue.  The patient is asleep and is not able to stay awake long enough to give me any history.  According to medics, his coworkers called EMS because he could not stay awake at work.  They report that he is going through a difficult time in his marriage and that he says he has not been sleeping well.  Other than that no history or review of systems is available.  Reportedly was able to deny any sense to his nurse initially upon triage but he was not able to stay awake long enough with me to form a coherent sentence.  See hospital course for details.  Past Medical History:  Diagnosis Date  . Diabetes mellitus without complication [facility])     Patient Active Problem List   Diagnosis Date Noted  . Fall 10/02/2017  . Pelvic fracture (HCC) 10/01/2017    History reviewed. No pertinent surgical history.  Prior to Admission medications   Medication Sig Start Date End Date Taking? Authorizing Provider  cyclobenzaprine (FLEXERIL) 10 MG tablet Take 10 mg by mouth 2 (two) times daily. 01/13/18  Yes [provider]  glipiZIDE (GLUCOTROL) 5 MG tablet Take 5 mg by mouth daily. 08/24/17  Yes [provider]  lisinopril (PRINIVIL,ZESTRIL) 10 MG tablet Take 10 mg by mouth daily. 08/24/17  Yes [provider]  lovastatin (MEVACOR) 40 MG tablet Take 40 mg by mouth daily. 08/24/17  Yes [provider]  meloxicam (MOBIC) 15 MG tablet Take 1 tablet by mouth daily. 01/01/18  Yes [provider]    metFORMIN (GLUCOPHAGE) 1000 MG tablet Take 1,000 mg by mouth 2 (two) times daily. 08/24/17  Yes [provider]  traMADol (ULTRAM) 50 MG tablet Take 50 mg by mouth every 6 (six) hours as needed for pain. 01/18/18  Yes [provider]  cyclobenzaprine (FLEXERIL) 5 MG tablet Take 1 tablet (5 mg total) by mouth 3 (three) times daily as needed for muscle spasms. Patient not taking: Reported on 01/22/2018 09/30/17   Willy Eddy, MD  gabapentin (NEURONTIN) 300 MG capsule Take 300 mg by mouth 3 (three) times daily. 04/12/16   [provider]  HYDROcodone-acetaminophen (NORCO/VICODIN) 5-325 MG tablet Take 1-2 tablets by mouth every 4 (four) hours as needed for moderate pain. Patient not taking: Reported on 01/22/2018 10/02/17   Salary, Evelena Asa, MD    Allergies Patient has no known allergies.  No family history on file.  Social History Social History   Tobacco Use  . Smoking status: Current Every Day Smoker    Packs/day: 1.00    Types: Cigarettes  . Smokeless tobacco: Never Used  Substance Use Topics  . Alcohol use: No    Frequency: Never  . Drug use: No    Review of Systems Level 5 caveat:  history/ROS limited by altered mental status/confusion  ____________________________________________   PHYSICAL EXAM:  VITAL SIGNS: ED Triage Vitals  Enc Vitals Group     BP 01/22/18 0248 (!) 142/89     Pulse Rate 01/22/18 0248 (!) 108  Resp 01/22/18 0248 13     Temp 01/22/18 0248 97.7 F (36.5 C)     Temp Source 01/22/18 0248 Oral     SpO2 01/22/18 0248 98 %     Weight 01/22/18 0249 88.5 kg (195 lb)     Height 01/22/18 0249 1.829 m (6')     Head Circumference --      Peak Flow --      Pain Score 01/22/18 0249 0     Pain Loc --      Pain Edu? --      Excl. in GC? --     Constitutional: Extremely somnolent, will awaken to loud voice and physical stimuli but is not able to stay awake. Eyes: Conjunctivae are normal.  Pupils sluggish but reactive. Head:  Atraumatic. Nose: No congestion/rhinnorhea. Mouth/Throat: Mucous membranes are moist. Neck: No stridor.  No meningeal signs.   Cardiovascular: Normal rate, regular rhythm. Good peripheral circulation. Grossly normal heart sounds. Respiratory: Normal respiratory effort.  No retractions. Lungs CTAB. Gastrointestinal: Soft and nontender. No distention.  Musculoskeletal: No lower extremity tenderness nor edema. No gross deformities of extremities. Neurologic: GCS is approximately 11-12 given his inability to stay awake and speak coherently.  He has no obvious signs of focal neurological deficits and was speaking more clearly to the ED nurse before I saw him. Skin:  Skin is warm, dry and intact. No rash noted.   ____________________________________________   LABS (all labs ordered are listed, but only abnormal results are displayed)  Labs Reviewed  GLUCOSE, CAPILLARY - Abnormal; Notable for the following components:      Result Value   Glucose-Capillary 404 (*)    All other components within normal limits  BASIC METABOLIC PANEL - Abnormal; Notable for the following components:   Chloride 100 (*)    Glucose, Bld 371 (*)    All other components within normal limits  CBC - Abnormal; Notable for the following components:   RBC 3.91 (*)    Hemoglobin 12.4 (*)    HCT 35.7 (*)    All other components within normal limits  URINALYSIS, COMPLETE (UACMP) WITH MICROSCOPIC - Abnormal; Notable for the following components:   Color, Urine YELLOW (*)    APPearance CLEAR (*)    Glucose, UA >=500 (*)    Hgb urine dipstick SMALL (*)    All other components within normal limits  URINE DRUG SCREEN, QUALITATIVE ([facility] ONLY) - Abnormal; Notable for the following components:   Opiate, Ur Screen POSITIVE (*)    Barbiturates, Ur Screen   (*)    Value: Result not available. Reagent lot number recalled by manufacturer.   Benzodiazepine, Ur Scrn POSITIVE (*)    All other components within normal limits    ETHANOL  CBG MONITORING, ED   ____________________________________________  EKG  No indication for EKG ____________________________________________  RADIOLOGY   ED MD interpretation: No indication for imaging  Official radiology report(s): No results found.  ____________________________________________   PROCEDURES  Critical Care performed: No   Procedure(s) performed:   Procedures   ____________________________________________   INITIAL IMPRESSION / ASSESSMENT AND PLAN / ED COURSE  As part of my medical decision making, I reviewed the following data within the electronic MEDICAL RECORD NUMBER Nursing notes reviewed and incorporated, Labs reviewed , Old chart reviewed and Notes from prior ED visits    Differential diagnosis includes, but is not limited to, fatigue, acute infection, intoxication on alcohol, benzodiazepines, opiates, or other substances, metabolic or electrolyte derangement, anemia, DKA.  Fortunately the patient's vital signs are stable and appropriate.  His CBC is normal, ethanol level is negative, basic metabolic panel is notable only for hyperglycemia but with a normal anion gap, urinalysis and urine drug screen still pending.  I have a very low suspicion for infectious process.  He may simply be severely fatigued but my suspicion is that we will find substances present in his urine drug screen, most likely benzodiazepines.  We will continue to monitor him for sobriety and for any signs of decompensation but at this time he is protecting airway it is appropriate to leave him on the pulse oximeter and cardiac monitor without any more aggressive evaluation or management.  Clinical Course as of Jan 23 1015  Sat Jan 22, 2018  0744 UDS positive for opiates and benzos, likely the source of the patient's somnolence.  Needs to metabolize the meds in the ED until he can be reassessed for SI given recent social/family stressors.  No indication for IVC at this time,  however; no indication of intentional overdose at this time.  Transferred ED care to Dr. Roxan Hockeyobinson for reassessment when clinically sober.   [CF]  0745 Lab work otherwise unremarkable other than hyperglycemia.   [CF]    Clinical Course User Index [CF] Loleta RoseForbach, Jru Pense, MD    ____________________________________________  FINAL CLINICAL IMPRESSION(S) / ED DIAGNOSES  Final diagnoses:  Somnolence  Benzodiazepine-based tranquilizers causing adverse effect in therapeutic use, initial encounter  Opioid use  Hyperglycemia     MEDICATIONS GIVEN DURING THIS VISIT:  Medications - No data to display   ED Discharge Orders    None       Note:  This document was prepared using Dragon voice recognition software and may include unintentional dictation errors.    Loleta RoseForbach, Shaira Sova, MD 01/22/18 1016

## 2018-01-22 NOTE — Discharge Instructions (Addendum)
You were likely too fatigued to work or function at all today because of taking both narcotics and benzodiazepines, in addition to having trouble sleeping.  Please do not take these medications together, and absolutely do not drive or operate machinery when doing so.  Follow up with your regular doctor at the next available opportunity.  Return to the emergency department if you develop new or worsening symptoms that concern you.
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
## 2018-01-22 NOTE — ED Notes (Signed)
Pt woken up, pt alert and oriented x 4. Pt falls back asleep quickly.

## 2018-01-22 NOTE — ED Triage Notes (Signed)
Pt comes into the ED via [facility] from work where his coworkers noticed the patient dozing off regularly.  Patient is arousal at this time but is more lethargic than normal.  Patient is diabetic and HTN but has not been taking his medications recently.  Patient has gone through recent separation and states that he has not had much sleep lately.  Patient in NAD at this time with even and unlabored respirations.  Patient denies any complaints other than "being sleepy".

## 2018-01-22 NOTE — ED Notes (Signed)
Pt ambulated in the room. Pt is sitting at bedside eating a food tray and drinking coffee.

## 2020-08-29 ENCOUNTER — Emergency Department
Admission: EM | Admit: 2020-08-29 | Discharge: 2020-08-29 | Disposition: A | Discharge status: Home / Self Care | Payer: BLUE CROSS/BLUE SHIELD | Attending: Emergency Medicine | Admitting: Emergency Medicine

## 2020-08-29 ENCOUNTER — Other Ambulatory Visit: Payer: Self-pay

## 2020-08-29 ENCOUNTER — Encounter: Payer: Self-pay | Admitting: Emergency Medicine

## 2020-08-29 DIAGNOSIS — Z7984 Long term (current) use of oral hypoglycemic drugs: Secondary | ICD-10-CM | POA: Insufficient documentation

## 2020-08-29 DIAGNOSIS — F17210 Nicotine dependence, cigarettes, uncomplicated: Secondary | ICD-10-CM | POA: Insufficient documentation

## 2020-08-29 DIAGNOSIS — E119 Type 2 diabetes mellitus without complications: Secondary | ICD-10-CM | POA: Insufficient documentation

## 2020-08-29 DIAGNOSIS — T401X1A Poisoning by heroin, accidental (unintentional), initial encounter: Secondary | ICD-10-CM | POA: Insufficient documentation

## 2020-08-29 DIAGNOSIS — Z79899 Other long term (current) drug therapy: Secondary | ICD-10-CM | POA: Insufficient documentation

## 2020-08-29 LAB — CBG MONITORING, ED: Glucose-Capillary: 283 mg/dL — ABNORMAL HIGH (ref 70–99)

## 2020-08-29 LAB — BASIC METABOLIC PANEL
Anion gap: 9 (ref 5–15)
BUN: 18 mg/dL (ref 6–20)
CO2: 33 mmol/L — ABNORMAL HIGH (ref 22–32)
Calcium: 10 mg/dL (ref 8.9–10.3)
Chloride: 96 mmol/L — ABNORMAL LOW (ref 98–111)
Creatinine, Ser: 1 mg/dL (ref 0.61–1.24)
GFR, Estimated: >60 mL/min (ref >60)
Glucose, Bld: 336 mg/dL — ABNORMAL HIGH (ref 70–99)
Potassium: 4.4 mmol/L (ref 3.5–5.1)
Sodium: 138 mmol/L (ref 135–145)

## 2020-08-29 LAB — HEPATIC FUNCTION PANEL
ALT: 39 U/L (ref 0–44)
AST: 34 U/L (ref 15–41)
Albumin: 4.3 g/dL (ref 3.5–5.0)
Alkaline Phosphatase: 83 U/L (ref 38–126)
Bilirubin, Direct: <0.1 mg/dL (ref 0.0–0.2)
Total Bilirubin: 0.4 mg/dL (ref 0.3–1.2)
Total Protein: 7.4 g/dL (ref 6.5–8.1)

## 2020-08-29 LAB — CBC
HCT: 38 % — ABNORMAL LOW (ref 39.0–52.0)
Hemoglobin: 12.9 g/dL — ABNORMAL LOW (ref 13.0–17.0)
MCH: 31.5 pg (ref 26.0–34.0)
MCHC: 33.9 g/dL (ref 30.0–36.0)
MCV: 92.7 fL (ref 80.0–100.0)
Platelets: 274 10*3/uL (ref 150–400)
RBC: 4.1 MIL/uL — ABNORMAL LOW (ref 4.22–5.81)
RDW: 12.7 % (ref 11.5–15.5)
WBC: 11.2 10*3/uL — ABNORMAL HIGH (ref 4.0–10.5)
nRBC: 0 % (ref 0.0–0.2)

## 2020-08-29 MED ORDER — NALOXONE HCL 4 MG/0.1ML NA LIQD: NASAL | 1 refills | Status: DC

## 2020-08-29 NOTE — ED Provider Notes (Signed)
[facility] Emergency Department Provider Note  ____________________________________________   None    (approximate)  I have reviewed the triage vital signs and the nursing notes.   HISTORY  Chief Complaint Drug Overdose    HPI [name] is a 55 y.o. male with diabetes who comes in unresponsive. Pt reports snorting heroin due to knee pain. Pt given 2mg  Narcan. Pt is alert and oriented now and denies any attempt of SI.  States he did it because of pain and hadn't used in a while.  Denies headaches or confusion. Does not think he fell. Overdose occurred 1 time, earlier this morning, better with Narcan,          Past Medical History:  Diagnosis Date  . Diabetes mellitus without complication [facility])     Patient Active Problem List   Diagnosis Date Noted  . Fall 10/02/2017  . Pelvic fracture (HCC) 10/01/2017    History reviewed. No pertinent surgical history.  Prior to Admission medications   Medication Sig Start Date End Date Taking? Authorizing Provider  cyclobenzaprine (FLEXERIL) 10 MG tablet Take 10 mg by mouth 2 (two) times daily. 01/13/18   [provider]  cyclobenzaprine (FLEXERIL) 5 MG tablet Take 1 tablet (5 mg total) by mouth 3 (three) times daily as needed for muscle spasms. Patient not taking: Reported on 01/22/2018 09/30/17   10/02/17, MD  gabapentin (NEURONTIN) 300 MG capsule Take 300 mg by mouth 3 (three) times daily. 04/12/16   [provider]  glipiZIDE (GLUCOTROL) 5 MG tablet Take 5 mg by mouth daily. 08/24/17   [provider]  HYDROcodone-acetaminophen (NORCO/VICODIN) 5-325 MG tablet Take 1-2 tablets by mouth every 4 (four) hours as needed for moderate pain. Patient not taking: Reported on 01/22/2018 10/02/17   Salary, 12/02/17 D, MD  lisinopril (PRINIVIL,ZESTRIL) 10 MG tablet Take 10 mg by mouth daily. 08/24/17   [provider]  lovastatin (MEVACOR) 40 MG tablet Take 40 mg by mouth daily.  08/24/17   [provider]  meloxicam (MOBIC) 15 MG tablet Take 1 tablet by mouth daily. 01/01/18   [provider]  metFORMIN (GLUCOPHAGE) 1000 MG tablet Take 1,000 mg by mouth 2 (two) times daily. 08/24/17   [provider]  traMADol (ULTRAM) 50 MG tablet Take 50 mg by mouth every 6 (six) hours as needed for pain. 01/18/18   [provider]    Allergies Patient has no known allergies.  History reviewed. No pertinent family history.  Social History Social History   Tobacco Use  . Smoking status: Current Every Day Smoker    Packs/day: 1.00    Types: Cigarettes  . Smokeless tobacco: Never Used  Substance Use Topics  . Alcohol use: No  . Drug use: No      Review of Systems Constitutional: No fever/chills, + unresponsiveness  Eyes: No visual changes. ENT: No sore throat. Cardiovascular: Denies chest pain. Respiratory: Denies shortness of breath. Gastrointestinal: No abdominal pain.  No nausea, no vomiting.  No diarrhea.  No constipation. Genitourinary: Negative for dysuria. Musculoskeletal: Negative for back pain. Knee pain left  Skin: Negative for rash. Neurological: Negative for headaches, focal weakness or numbness. All other ROS negative ____________________________________________   PHYSICAL EXAM:  VITAL SIGNS: ED Triage Vitals [08/29/20 0843]  Enc Vitals Group     BP (!) 159/81     Pulse Rate (!) 102     Resp 16     Temp 97.9 F (36.6 C)  Temp Source Oral     SpO2 98 %     Weight 185 lb (83.9 kg)     Height 6' (1.829 m)     Head Circumference      Peak Flow      Pain Score 0     Pain Loc      Pain Edu?      Excl. in GC?     Constitutional: Alert and oriented. Well appearing and in no acute distress. Eyes: Conjunctivae are normal. EOMI. Head: Atraumatic. Nose: No congestion/rhinnorhea. Mouth/Throat: Mucous membranes are moist.   Neck: No stridor. Trachea Midline. FROM Cardiovascular: Normal rate, regular rhythm.  Grossly normal heart sounds.  Good peripheral circulation. Respiratory: Normal respiratory effort.  No retractions. Lungs CTAB. Gastrointestinal: Soft and nontender. No distention. No abdominal bruits.  Musculoskeletal: No lower extremity tenderness nor edema.  No joint effusions. Small abrasions over knee but otherwise able to range knee. Able to ambulate  Neurologic:  Normal speech and language. No gross focal neurologic deficits are appreciated.  Skin:  Skin is warm, dry and intact. No rash noted. Psychiatric: Mood and affect are normal. Speech and behavior are normal. GU: Deferred   ____________________________________________   LABS (all labs ordered are listed, but only abnormal results are displayed)  Labs Reviewed  CBC  BASIC METABOLIC PANEL   ____________________________________________   ED ECG REPORT I, Concha Se, the attending physician, personally viewed and interpreted this ECG.  Sinus tachycardia rate of 102, no st elevation, no twi normal intervals  ____________________________________________  PROCEDURES  Procedure(s) performed (including Critical Care):  Procedures   ____________________________________________   INITIAL IMPRESSION / ASSESSMENT AND PLAN / ED COURSE  [name] was evaluated in Emergency Department on 08/29/2020 for the symptoms described in the history of present illness. He was evaluated in the context of the global COVID-19 pandemic, which necessitated consideration that the patient might be at risk for infection with the SARS-CoV-2 virus that causes COVID-19. Institutional protocols and algorithms that pertain to the evaluation of patients at risk for COVID-19 are in a state of rapid change based on information released by regulatory bodies including the CDC and federal and state organizations. These policies and algorithms were followed during the patient's care in the ED.     Pt comes in with drug overdose. Reports using heroin-  responsive after narcan. Will get labs to evaluate for electrolyte abdnormality/aki. Denies head trauma and alert without evidence of trauma.  No SOB/hypoxia to suggest aspiration. No c-spine tenderness. Left knee no evidence of fracture/septic joint. Some small abrasions.   11:20 AM Patient ambulates well. He denies any headaches or signs of trauma. He is ambulating around the room. Normal oxygen levels. His sugar was slightly elevated but he states that he did not take his medications. He is not on methadone. Was on it previously. Patient feels comfortable being discharged home we'll give a prescription for Narcan       ____________________________________________   FINAL CLINICAL IMPRESSION(S) / ED DIAGNOSES   Final diagnoses:  Accidental overdose of heroin, initial encounter (HCC)      MEDICATIONS GIVEN DURING THIS VISIT:  Medications - No data to display   ED Discharge Orders         Ordered    naloxone (NARCAN) nasal spray 4 mg/0.1 mL        08/29/20 1119           Note:  This document was prepared using Dragon voice recognition software  and may include unintentional dictation errors.   Concha Se, MD 08/29/20 1121

## 2020-08-29 NOTE — ED Notes (Signed)
Patient placed near first nurse for safety.  Monitoring Spo2 and pulse with dinamap.

## 2020-08-29 NOTE — ED Notes (Signed)
Says he stopped using for about 4 hours when he did a methadone clinic out of [place].  Says he was no longer on methadone and this was his second use since restarting.  He does drift off during conversation, but ox is 99.  He was able to get up and walk steadily around the room.  Says he is on diabetes and bp meds, but did not take any today.
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
## 2020-08-29 NOTE — ED Notes (Signed)
Pt calm , collective , denied pain or sob , discharge instructions reviewed

## 2020-08-29 NOTE — Discharge Instructions (Signed)
Your sugar was elevated. Take your diabetes medicine. Do not use heroin. Take Narcan if you have another overdose or a friend has an overdose

## 2021-02-08 ENCOUNTER — Other Ambulatory Visit: Payer: Self-pay

## 2021-02-08 ENCOUNTER — Emergency Department (HOSPITAL_COMMUNITY): Payer: Self-pay

## 2021-02-08 ENCOUNTER — Inpatient Hospital Stay (HOSPITAL_COMMUNITY)
Admission: EM | Admit: 2021-02-08 | Discharge: 2021-02-11 | DRG: 083 | Disposition: A | Discharge status: Home / Self Care | Payer: Self-pay | Attending: Surgery | Admitting: Surgery

## 2021-02-08 DIAGNOSIS — Z20822 Contact with and (suspected) exposure to covid-19: Secondary | ICD-10-CM | POA: Diagnosis present

## 2021-02-08 DIAGNOSIS — I609 Nontraumatic subarachnoid hemorrhage, unspecified: Secondary | ICD-10-CM

## 2021-02-08 DIAGNOSIS — I10 Essential (primary) hypertension: Secondary | ICD-10-CM | POA: Diagnosis present

## 2021-02-08 DIAGNOSIS — R402362 Coma scale, best motor response, obeys commands, at arrival to emergency department: Secondary | ICD-10-CM | POA: Diagnosis present

## 2021-02-08 DIAGNOSIS — S62604A Fracture of unspecified phalanx of right ring finger, initial encounter for closed fracture: Secondary | ICD-10-CM | POA: Diagnosis present

## 2021-02-08 DIAGNOSIS — S066X9A Traumatic subarachnoid hemorrhage with loss of consciousness of unspecified duration, initial encounter: Principal | ICD-10-CM | POA: Diagnosis present

## 2021-02-08 DIAGNOSIS — M79641 Pain in right hand: Secondary | ICD-10-CM

## 2021-02-08 DIAGNOSIS — R402242 Coma scale, best verbal response, confused conversation, at arrival to emergency department: Secondary | ICD-10-CM | POA: Diagnosis present

## 2021-02-08 DIAGNOSIS — S42102A Fracture of unspecified part of scapula, left shoulder, initial encounter for closed fracture: Secondary | ICD-10-CM | POA: Diagnosis present

## 2021-02-08 DIAGNOSIS — E119 Type 2 diabetes mellitus without complications: Secondary | ICD-10-CM | POA: Diagnosis present

## 2021-02-08 DIAGNOSIS — S32019A Unspecified fracture of first lumbar vertebra, initial encounter for closed fracture: Secondary | ICD-10-CM | POA: Diagnosis present

## 2021-02-08 DIAGNOSIS — S22019A Unspecified fracture of first thoracic vertebra, initial encounter for closed fracture: Secondary | ICD-10-CM | POA: Diagnosis present

## 2021-02-08 DIAGNOSIS — T1490XA Injury, unspecified, initial encounter: Secondary | ICD-10-CM

## 2021-02-08 DIAGNOSIS — S62602A Fracture of unspecified phalanx of right middle finger, initial encounter for closed fracture: Secondary | ICD-10-CM | POA: Diagnosis present

## 2021-02-08 DIAGNOSIS — Z23 Encounter for immunization: Secondary | ICD-10-CM

## 2021-02-08 DIAGNOSIS — Y92410 Unspecified street and highway as the place of occurrence of the external cause: Secondary | ICD-10-CM

## 2021-02-08 DIAGNOSIS — R402132 Coma scale, eyes open, to sound, at arrival to emergency department: Secondary | ICD-10-CM | POA: Diagnosis present

## 2021-02-08 DIAGNOSIS — S2232XA Fracture of one rib, left side, initial encounter for closed fracture: Secondary | ICD-10-CM | POA: Diagnosis present

## 2021-02-08 LAB — I-STAT CHEM 8, ED
BUN: 10 mg/dL (ref 6–20)
Calcium, Ion: 1.19 mmol/L (ref 1.15–1.40)
Chloride: 99 mmol/L (ref 98–111)
Creatinine, Ser: 0.6 mg/dL — ABNORMAL LOW (ref 0.61–1.24)
Glucose, Bld: 182 mg/dL — ABNORMAL HIGH (ref 70–99)
HCT: 38 % — ABNORMAL LOW (ref 39.0–52.0)
Hemoglobin: 12.9 g/dL — ABNORMAL LOW (ref 13.0–17.0)
Potassium: 4 mmol/L (ref 3.5–5.1)
Sodium: 138 mmol/L (ref 135–145)
TCO2: 28 mmol/L (ref 22–32)

## 2021-02-08 MED ORDER — DOCUSATE SODIUM 100 MG PO CAPS
100.0000 mg | ORAL_CAPSULE | Freq: Two times a day (BID) | ORAL | Status: DC
  Administered 2021-02-09 – 2021-02-11 (×4): 100 mg via ORAL
  Filled 2021-02-08 (×4): qty 1

## 2021-02-08 MED ORDER — ONDANSETRON 4 MG PO TBDP
4.0000 mg | ORAL_TABLET | Freq: Four times a day (QID) | ORAL | Status: DC | PRN
Start: 2021-02-08 — End: 2021-02-11

## 2021-02-08 MED ORDER — SODIUM CHLORIDE 0.9 % IV SOLN: INTRAVENOUS | Status: DC

## 2021-02-08 MED ORDER — LEVETIRACETAM IN NACL 1000 MG/100ML IV SOLN
1000.0000 mg | Freq: Once | INTRAVENOUS | Status: AC
  Administered 2021-02-09: 1000 mg via INTRAVENOUS
  Filled 2021-02-08: qty 100

## 2021-02-08 MED ORDER — ONDANSETRON HCL 4 MG/2ML IJ SOLN
4.0000 mg | Freq: Four times a day (QID) | INTRAMUSCULAR | Status: DC | PRN
Start: 2021-02-08 — End: 2021-02-11

## 2021-02-08 MED ORDER — MORPHINE SULFATE (PF) 4 MG/ML IV SOLN
4.0000 mg | INTRAVENOUS | Status: DC | PRN
  Filled 2021-02-08: qty 1

## 2021-02-08 MED ORDER — OXYCODONE HCL 5 MG PO TABS
5.0000 mg | ORAL_TABLET | ORAL | Status: DC | PRN
  Administered 2021-02-09: 10 mg via ORAL
  Administered 2021-02-09: 5 mg via ORAL
  Administered 2021-02-10 (×3): 10 mg via ORAL
  Administered 2021-02-10: 5 mg via ORAL
  Administered 2021-02-11: 10 mg via ORAL
  Filled 2021-02-08 (×4): qty 2
  Filled 2021-02-08 (×2): qty 1
  Filled 2021-02-08: qty 2

## 2021-02-08 MED ORDER — LEVETIRACETAM IN NACL 500 MG/100ML IV SOLN
500.0000 mg | Freq: Two times a day (BID) | INTRAVENOUS | Status: DC
  Administered 2021-02-09 – 2021-02-10 (×4): 500 mg via INTRAVENOUS
  Filled 2021-02-08 (×5): qty 100

## 2021-02-08 MED ORDER — ACETAMINOPHEN 500 MG PO TABS
1000.0000 mg | ORAL_TABLET | Freq: Four times a day (QID) | ORAL | Status: DC
  Administered 2021-02-09 – 2021-02-11 (×8): 1000 mg via ORAL
  Filled 2021-02-08 (×9): qty 2

## 2021-02-08 NOTE — ED Notes (Signed)
Patient transported to CT with [facility]
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
## 2021-02-09 ENCOUNTER — Encounter (HOSPITAL_COMMUNITY): Payer: Self-pay

## 2021-02-09 ENCOUNTER — Inpatient Hospital Stay (HOSPITAL_COMMUNITY): Payer: Self-pay

## 2021-02-09 LAB — CBC WITH DIFFERENTIAL/PLATELET
Abs Immature Granulocytes: 0.11 10*3/uL — ABNORMAL HIGH (ref 0.00–0.07)
Basophils Absolute: 0 10*3/uL (ref 0.0–0.1)
Basophils Relative: 0 %
Eosinophils Absolute: 0.1 10*3/uL (ref 0.0–0.5)
Eosinophils Relative: 1 %
HCT: 35.1 % — ABNORMAL LOW (ref 39.0–52.0)
Hemoglobin: 11.5 g/dL — ABNORMAL LOW (ref 13.0–17.0)
Immature Granulocytes: 1 %
Lymphocytes Relative: 10 %
Lymphs Abs: 1.6 10*3/uL (ref 0.7–4.0)
MCH: 30.6 pg (ref 26.0–34.0)
MCHC: 32.8 g/dL (ref 30.0–36.0)
MCV: 93.4 fL (ref 80.0–100.0)
Monocytes Absolute: 1 10*3/uL (ref 0.1–1.0)
Monocytes Relative: 6 %
Neutro Abs: 13.6 10*3/uL — ABNORMAL HIGH (ref 1.7–7.7)
Neutrophils Relative %: 82 %
Platelets: 279 10*3/uL (ref 150–400)
RBC: 3.76 MIL/uL — ABNORMAL LOW (ref 4.22–5.81)
RDW: 12.4 % (ref 11.5–15.5)
WBC: 16.4 10*3/uL — ABNORMAL HIGH (ref 4.0–10.5)
nRBC: 0 % (ref 0.0–0.2)

## 2021-02-09 LAB — URINALYSIS, ROUTINE W REFLEX MICROSCOPIC
Bilirubin Urine: NEGATIVE
Glucose, UA: 50 mg/dL — AB
Hgb urine dipstick: NEGATIVE
Ketones, ur: NEGATIVE mg/dL
Leukocytes,Ua: NEGATIVE
Nitrite: NEGATIVE
Protein, ur: NEGATIVE mg/dL
Specific Gravity, Urine: 1.033 — ABNORMAL HIGH (ref 1.005–1.030)
pH: 6 (ref 5.0–8.0)

## 2021-02-09 LAB — ETHANOL: Alcohol, Ethyl (B): <10 mg/dL (ref <10)

## 2021-02-09 LAB — SAMPLE TO BLOOD BANK

## 2021-02-09 LAB — BASIC METABOLIC PANEL
Anion gap: 6 (ref 5–15)
BUN: 8 mg/dL (ref 6–20)
CO2: 30 mmol/L (ref 22–32)
Calcium: 8.7 mg/dL — ABNORMAL LOW (ref 8.9–10.3)
Chloride: 101 mmol/L (ref 98–111)
Creatinine, Ser: 0.6 mg/dL — ABNORMAL LOW (ref 0.61–1.24)
GFR, Estimated: >60 mL/min (ref >60)
Glucose, Bld: 142 mg/dL — ABNORMAL HIGH (ref 70–99)
Potassium: 3.8 mmol/L (ref 3.5–5.1)
Sodium: 137 mmol/L (ref 135–145)

## 2021-02-09 LAB — COMPREHENSIVE METABOLIC PANEL
ALT: 29 U/L (ref 0–44)
AST: 24 U/L (ref 15–41)
Albumin: 3.2 g/dL — ABNORMAL LOW (ref 3.5–5.0)
Alkaline Phosphatase: 96 U/L (ref 38–126)
Anion gap: 9 (ref 5–15)
BUN: 9 mg/dL (ref 6–20)
CO2: 28 mmol/L (ref 22–32)
Calcium: 9.2 mg/dL (ref 8.9–10.3)
Chloride: 101 mmol/L (ref 98–111)
Creatinine, Ser: 0.65 mg/dL (ref 0.61–1.24)
GFR, Estimated: >60 mL/min (ref >60)
Glucose, Bld: 186 mg/dL — ABNORMAL HIGH (ref 70–99)
Potassium: 4.1 mmol/L (ref 3.5–5.1)
Sodium: 138 mmol/L (ref 135–145)
Total Bilirubin: 0.6 mg/dL (ref 0.3–1.2)
Total Protein: 6.2 g/dL — ABNORMAL LOW (ref 6.5–8.1)

## 2021-02-09 LAB — MRSA NEXT GEN BY PCR, NASAL: MRSA by PCR Next Gen: NOT DETECTED

## 2021-02-09 LAB — CBC
HCT: 38.9 % — ABNORMAL LOW (ref 39.0–52.0)
Hemoglobin: 12.5 g/dL — ABNORMAL LOW (ref 13.0–17.0)
MCH: 30.3 pg (ref 26.0–34.0)
MCHC: 32.1 g/dL (ref 30.0–36.0)
MCV: 94.4 fL (ref 80.0–100.0)
Platelets: 277 10*3/uL (ref 150–400)
RBC: 4.12 MIL/uL — ABNORMAL LOW (ref 4.22–5.81)
RDW: 12.4 % (ref 11.5–15.5)
WBC: 10.8 10*3/uL — ABNORMAL HIGH (ref 4.0–10.5)
nRBC: 0 % (ref 0.0–0.2)

## 2021-02-09 LAB — GLUCOSE, CAPILLARY
Glucose-Capillary: 337 mg/dL — ABNORMAL HIGH (ref 70–99)
Glucose-Capillary: 92 mg/dL (ref 70–99)

## 2021-02-09 LAB — RAPID URINE DRUG SCREEN, HOSP PERFORMED
Amphetamines: POSITIVE — AB
Barbiturates: NOT DETECTED
Benzodiazepines: NOT DETECTED
Cocaine: NOT DETECTED
Opiates: NOT DETECTED
Tetrahydrocannabinol: NOT DETECTED

## 2021-02-09 LAB — PROTIME-INR
INR: 0.9 (ref 0.8–1.2)
Prothrombin Time: 12.2 seconds (ref 11.4–15.2)

## 2021-02-09 LAB — LACTIC ACID, PLASMA: Lactic Acid, Venous: 1.4 mmol/L (ref 0.5–1.9)

## 2021-02-09 LAB — RESP PANEL BY RT-PCR (FLU A&B, COVID) ARPGX2
Influenza A by PCR: NEGATIVE
Influenza B by PCR: NEGATIVE
SARS Coronavirus 2 by RT PCR: NEGATIVE

## 2021-02-09 LAB — HIV ANTIBODY (ROUTINE TESTING W REFLEX): HIV Screen 4th Generation wRfx: NONREACTIVE

## 2021-02-09 MED ORDER — NICOTINE 21 MG/24HR TD PT24
21.0000 mg | MEDICATED_PATCH | Freq: Every day | TRANSDERMAL | Status: DC
  Administered 2021-02-09 – 2021-02-11 (×3): 21 mg via TRANSDERMAL
  Filled 2021-02-09 (×3): qty 1

## 2021-02-09 MED ORDER — TETANUS-DIPHTH-ACELL PERTUSSIS 5-2.5-18.5 LF-MCG/0.5 IM SUSY
0.5000 mL | PREFILLED_SYRINGE | Freq: Once | INTRAMUSCULAR | Status: AC
  Administered 2021-02-09: 0.5 mL via INTRAMUSCULAR
  Filled 2021-02-09: qty 0.5

## 2021-02-09 MED ORDER — INSULIN ASPART 100 UNIT/ML IJ SOLN
0.0000 [IU] | INTRAMUSCULAR | Status: DC
  Administered 2021-02-09: 11 [IU] via SUBCUTANEOUS
  Administered 2021-02-10: 8 [IU] via SUBCUTANEOUS
  Administered 2021-02-10: 5 [IU] via SUBCUTANEOUS
  Administered 2021-02-10: 11 [IU] via SUBCUTANEOUS
  Administered 2021-02-10: 5 [IU] via SUBCUTANEOUS
  Administered 2021-02-10: 3 [IU] via SUBCUTANEOUS
  Administered 2021-02-10: 2 [IU] via SUBCUTANEOUS
  Administered 2021-02-11: 3 [IU] via SUBCUTANEOUS
  Administered 2021-02-11: 11 [IU] via SUBCUTANEOUS
  Administered 2021-02-11: 3 [IU] via SUBCUTANEOUS

## 2021-02-09 MED ORDER — CHLORHEXIDINE GLUCONATE CLOTH 2 % EX PADS
6.0000 | MEDICATED_PAD | Freq: Every day | CUTANEOUS | Status: DC
  Administered 2021-02-11: 6 via TOPICAL

## 2021-02-09 MED ORDER — IOHEXOL 300 MG/ML  SOLN
100.0000 mL | Freq: Once | INTRAMUSCULAR | Status: AC | PRN
  Administered 2021-02-09: 100 mL via INTRAVENOUS
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
## 2021-02-09 NOTE — Progress Notes (Signed)
PT Cancellation Note  Patient Details Name: [name] MRN: [id number] DOB: 04/14/1966   Cancelled Treatment:    Reason Eval/Treat Not Completed: Other (comment). Awaiting spinal brace prior to mobilizing as Dr. Yetta Barre reporting pt will require a spinal brace. Will follow-up as time permits.   Raymond Gurney, PT, DPT Acute Rehabilitation Services  Pager: [phone] Office: [phone]    Jewel Baize 02/09/2021, 8:15 AM

## 2021-02-09 NOTE — Consult Note (Signed)
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 without any rotation or difficulty.  Patient is able to actively move the shoulder on the left without significant discomfort.  He is able to move digits on the left as well.  Palpable radial pulses.  Unable to assess for detailed motor or sensory exam due to patient mental status change.  Skin:    General: Skin is warm.  Neurological:     General: No focal deficit present.  Psychiatric:     Comments: Confused and sleepy    Assessment/Plan: Patient has a left scapular fracture and some hand swelling.  There is no images of his right hand and therefore those will be ordered now.  He does not need surgery for his left scapula.  Sling has been ordered for comfort.  He may weight-bear and range his left shoulder as tolerated.  He will follow-up in 4 weeks for repeat x-rays of his left  shoulder to ensure improvement.  No need to keep the patient n.p.o. from orthopedic standpoint.  We will make further treatment recommendations based on findings on the right hand x-rays if needed.  Terance Hart 02/09/2021, 7:32 AM

## 2021-02-09 NOTE — ED Notes (Signed)
Aspen collar placed. Maintained c-spine

## 2021-02-09 NOTE — Progress Notes (Signed)
I was called to look at XRs ordered by my partner Dr. Susa Simmonds this am.  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  I will put in order for ortho tech to place ulnar gutter splint.
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
## 2021-02-09 NOTE — Progress Notes (Signed)
Orthopedic Tech Progress Note Patient Details:  [name] 1965-10-03 [id number] TLSO was sized for patient  Ortho Devices Type of Ortho Device: Other (comment) Ortho Device/Splint Interventions: Application   Post Interventions Patient Tolerated: Well Instructions Provided: Adjustment of device  Pearse Shiffler E Launi Asencio 02/09/2021, 12:08 PM
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
## 2021-02-10 LAB — BASIC METABOLIC PANEL
Anion gap: 9 (ref 5–15)
BUN: 10 mg/dL (ref 6–20)
CO2: 27 mmol/L (ref 22–32)
Calcium: 9.2 mg/dL (ref 8.9–10.3)
Chloride: 99 mmol/L (ref 98–111)
Creatinine, Ser: 0.44 mg/dL — ABNORMAL LOW (ref 0.61–1.24)
GFR, Estimated: >60 mL/min (ref >60)
Glucose, Bld: 143 mg/dL — ABNORMAL HIGH (ref 70–99)
Potassium: 3.7 mmol/L (ref 3.5–5.1)
Sodium: 135 mmol/L (ref 135–145)

## 2021-02-10 LAB — CBC
HCT: 37.8 % — ABNORMAL LOW (ref 39.0–52.0)
Hemoglobin: 12.7 g/dL — ABNORMAL LOW (ref 13.0–17.0)
MCH: 30.3 pg (ref 26.0–34.0)
MCHC: 33.6 g/dL (ref 30.0–36.0)
MCV: 90.2 fL (ref 80.0–100.0)
Platelets: 274 10*3/uL (ref 150–400)
RBC: 4.19 MIL/uL — ABNORMAL LOW (ref 4.22–5.81)
RDW: 12.4 % (ref 11.5–15.5)
WBC: 11.2 10*3/uL — ABNORMAL HIGH (ref 4.0–10.5)
nRBC: 0 % (ref 0.0–0.2)

## 2021-02-10 LAB — GLUCOSE, CAPILLARY
Glucose-Capillary: 141 mg/dL — ABNORMAL HIGH (ref 70–99)
Glucose-Capillary: 197 mg/dL — ABNORMAL HIGH (ref 70–99)
Glucose-Capillary: 215 mg/dL — ABNORMAL HIGH (ref 70–99)
Glucose-Capillary: 234 mg/dL — ABNORMAL HIGH (ref 70–99)
Glucose-Capillary: 251 mg/dL — ABNORMAL HIGH (ref 70–99)
Glucose-Capillary: 334 mg/dL — ABNORMAL HIGH (ref 70–99)

## 2021-02-10 MED ORDER — TAMSULOSIN HCL 0.4 MG PO CAPS
0.4000 mg | ORAL_CAPSULE | Freq: Every day | ORAL | Status: DC
  Administered 2021-02-11 (×2): 0.4 mg via ORAL
  Filled 2021-02-10 (×2): qty 1

## 2021-02-10 MED ORDER — BASAGLAR KWIKPEN 100 UNIT/ML ~~LOC~~ SOPN: 10.0000 [IU] | PEN_INJECTOR | Freq: Every day | SUBCUTANEOUS | Status: DC

## 2021-02-10 MED ORDER — METFORMIN HCL 500 MG PO TABS: 1000.0000 mg | ORAL_TABLET | Freq: Two times a day (BID) | ORAL | Status: DC

## 2021-02-10 MED ORDER — LISINOPRIL 40 MG PO TABS
40.0000 mg | ORAL_TABLET | Freq: Every day | ORAL | Status: DC
  Administered 2021-02-10 – 2021-02-11 (×2): 40 mg via ORAL
  Filled 2021-02-10 (×2): qty 1

## 2021-02-10 MED ORDER — INSULIN GLARGINE 100 UNIT/ML ~~LOC~~ SOLN
10.0000 [IU] | Freq: Every day | SUBCUTANEOUS | Status: DC
  Administered 2021-02-10: 10 [IU] via SUBCUTANEOUS
  Filled 2021-02-10 (×3): qty 0.1

## 2021-02-10 MED ORDER — GLIPIZIDE 5 MG PO TABS
10.0000 mg | ORAL_TABLET | Freq: Two times a day (BID) | ORAL | Status: DC
  Administered 2021-02-10 – 2021-02-11 (×2): 10 mg via ORAL
  Filled 2021-02-10 (×2): qty 2

## 2021-02-10 MED ORDER — ATENOLOL 25 MG PO TABS
12.5000 mg | ORAL_TABLET | Freq: Two times a day (BID) | ORAL | Status: DC
  Administered 2021-02-10 – 2021-02-11 (×2): 12.5 mg via ORAL
  Filled 2021-02-10 (×2): qty 1
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
## 2021-02-10 NOTE — Plan of Care (Signed)
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
## 2021-02-10 NOTE — Progress Notes (Signed)
Attempted to call report to nurse on 6N. She had just left for lunch. I will attempt again in about 30 minutes. [name]
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
## 2021-02-10 NOTE — Progress Notes (Signed)
Orthopedic Tech Progress Note Patient Details:  [name] 07/01/1966 [id number] Applied ulnar gutter to RUE. Ortho Devices Type of Ortho Device: Ulna gutter splint Ortho Device/Splint Location: RUE Ortho Device/Splint Interventions: Ordered, Application   Post Interventions Patient Tolerated: Well Instructions Provided: Care of device  Kerry Fort 02/10/2021, 10:21 AM

## 2021-02-10 NOTE — Progress Notes (Signed)
Admitted from [address] [place] , alert, not in any distress. Oriented to room and staff. VSS. MD made aware of increased heart rate, home meds restarted.
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
## 2021-02-11 ENCOUNTER — Encounter: Payer: Self-pay | Admitting: Emergency Medicine

## 2021-02-11 ENCOUNTER — Inpatient Hospital Stay (HOSPITAL_COMMUNITY): Payer: Self-pay

## 2021-02-11 LAB — BASIC METABOLIC PANEL
Anion gap: 9 (ref 5–15)
BUN: 14 mg/dL (ref 6–20)
CO2: 27 mmol/L (ref 22–32)
Calcium: 9.3 mg/dL (ref 8.9–10.3)
Chloride: 99 mmol/L (ref 98–111)
Creatinine, Ser: 0.6 mg/dL — ABNORMAL LOW (ref 0.61–1.24)
GFR, Estimated: >60 mL/min (ref >60)
Glucose, Bld: 241 mg/dL — ABNORMAL HIGH (ref 70–99)
Potassium: 3.6 mmol/L (ref 3.5–5.1)
Sodium: 135 mmol/L (ref 135–145)

## 2021-02-11 LAB — GLUCOSE, CAPILLARY
Glucose-Capillary: 158 mg/dL — ABNORMAL HIGH (ref 70–99)
Glucose-Capillary: 328 mg/dL — ABNORMAL HIGH (ref 70–99)
Glucose-Capillary: 163 mg/dL — ABNORMAL HIGH (ref 70–99)

## 2021-02-11 LAB — CBC
HCT: 36.7 % — ABNORMAL LOW (ref 39.0–52.0)
Hemoglobin: 12.1 g/dL — ABNORMAL LOW (ref 13.0–17.0)
MCH: 29.8 pg (ref 26.0–34.0)
MCHC: 33 g/dL (ref 30.0–36.0)
MCV: 90.4 fL (ref 80.0–100.0)
Platelets: 297 10*3/uL (ref 150–400)
RBC: 4.06 MIL/uL — ABNORMAL LOW (ref 4.22–5.81)
RDW: 12.4 % (ref 11.5–15.5)
WBC: 9.7 10*3/uL (ref 4.0–10.5)
nRBC: 0 % (ref 0.0–0.2)

## 2021-02-11 MED ORDER — ACETAMINOPHEN 500 MG PO TABS: 1000.0000 mg | ORAL_TABLET | Freq: Four times a day (QID) | ORAL | 0 refills | Status: DC | PRN

## 2021-02-11 MED ORDER — LEVETIRACETAM 500 MG PO TABS: 500.0000 mg | ORAL_TABLET | Freq: Two times a day (BID) | ORAL | 0 refills | Status: AC

## 2021-02-11 MED ORDER — OXYCODONE HCL 5 MG PO TABS: 5.0000 mg | ORAL_TABLET | ORAL | 0 refills | Status: DC | PRN

## 2021-02-11 MED ORDER — LEVETIRACETAM 500 MG PO TABS
500.0000 mg | ORAL_TABLET | Freq: Two times a day (BID) | ORAL | Status: DC
  Administered 2021-02-11: 500 mg via ORAL
  Filled 2021-02-11: qty 1

## 2021-02-11 NOTE — Plan of Care (Signed)

## 2021-02-11 NOTE — Progress Notes (Signed)
Physician paged regarding discharge.
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
## 2021-02-11 NOTE — Progress Notes (Signed)
Patient anticipated discharge today. Expected to have outpatient therapy per case manager. Refused needing a BSC to take home with him. No orders for discharge as of now; mother called but did not answer. Phone placed in patient's room so he is able to call his mother.
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
## 2021-02-13 NOTE — Progress Notes (Signed)
Patient's mother called the unit to let us know that he was unable to obtain his pain medication from their pharmacy. She requested that it be sent to the [facility] in [place] Ph# [phone]. I contacted the original pharmacy and verified with them that they do not fill any narcotic prescriptions and had voided the RX. They told me they had tried to contact the office but was unable to reach them. Patient did pick up Keppra prescription. I sent a secure chat to Norris Cross., PA to see if she could help.  17:30 I verified with [facility] that they received the prescription for Oxycodone. Staff states that it is filled and ready. I attempted to call [phone] ( the number provider by patient's mother as their phone is off at this time) and it goes to VM. I left a HIPPA compliant VM requesting a call back to 6N.

## 2023-12-02 DEATH — deceased
